# Patient Record
Sex: Female | Born: 1960
Health system: Southern US, Community
[De-identification: ages and names within clinical notes are randomized; demographics above are authoritative.]

## PROBLEM LIST (undated history)

## (undated) DIAGNOSIS — M4800 Spinal stenosis, site unspecified: Secondary | ICD-10-CM

## (undated) DIAGNOSIS — F419 Anxiety disorder, unspecified: Secondary | ICD-10-CM

## (undated) DIAGNOSIS — E559 Vitamin D deficiency, unspecified: Secondary | ICD-10-CM

## (undated) DIAGNOSIS — E7800 Pure hypercholesterolemia, unspecified: Secondary | ICD-10-CM

## (undated) DIAGNOSIS — D75839 Thrombocytosis, unspecified: Secondary | ICD-10-CM

## (undated) DIAGNOSIS — F329 Major depressive disorder, single episode, unspecified: Secondary | ICD-10-CM

## (undated) DIAGNOSIS — I2510 Atherosclerotic heart disease of native coronary artery without angina pectoris: Secondary | ICD-10-CM

## (undated) DIAGNOSIS — Z87828 Personal history of other (healed) physical injury and trauma: Secondary | ICD-10-CM

## (undated) DIAGNOSIS — R55 Syncope and collapse: Secondary | ICD-10-CM

## (undated) DIAGNOSIS — I10 Essential (primary) hypertension: Secondary | ICD-10-CM

## (undated) DIAGNOSIS — F32A Depression, unspecified: Secondary | ICD-10-CM

## (undated) DIAGNOSIS — D473 Essential (hemorrhagic) thrombocythemia: Secondary | ICD-10-CM

## (undated) HISTORY — DX: Essential (hemorrhagic) thrombocythemia: D47.3

## (undated) HISTORY — DX: Thrombocytosis, unspecified: D75.839

## (undated) HISTORY — PX: ABDOMINAL HYSTERECTOMY: SHX81

## (undated) HISTORY — DX: Vitamin D deficiency, unspecified: E55.9

## (undated) HISTORY — DX: Depression, unspecified: F32.A

## (undated) HISTORY — DX: Atherosclerotic heart disease of native coronary artery without angina pectoris: I25.10

## (undated) HISTORY — DX: Major depressive disorder, single episode, unspecified: F32.9

## (undated) HISTORY — DX: Personal history of other (healed) physical injury and trauma: Z87.828

## (undated) HISTORY — DX: Syncope and collapse: R55

## (undated) HISTORY — DX: Essential (primary) hypertension: I10

## (undated) HISTORY — DX: Anxiety disorder, unspecified: F41.9

## (undated) HISTORY — DX: Pure hypercholesterolemia, unspecified: E78.00

## (undated) HISTORY — PX: OTHER SURGICAL HISTORY: SHX169

## (undated) HISTORY — DX: Spinal stenosis, site unspecified: M48.00

---

## 2002-05-19 ENCOUNTER — Emergency Department (HOSPITAL_COMMUNITY): Admission: EM | Admit: 2002-05-19 | Discharge: 2002-05-19 | Payer: Self-pay | Admitting: *Deleted

## 2004-10-11 ENCOUNTER — Emergency Department (HOSPITAL_COMMUNITY): Admission: EM | Admit: 2004-10-11 | Discharge: 2004-10-12 | Payer: Self-pay | Admitting: Emergency Medicine

## 2004-12-23 ENCOUNTER — Ambulatory Visit: Payer: Self-pay | Admitting: Family Medicine

## 2005-02-19 ENCOUNTER — Ambulatory Visit: Payer: Self-pay | Admitting: Family Medicine

## 2005-03-23 ENCOUNTER — Ambulatory Visit: Payer: Self-pay | Admitting: Family Medicine

## 2005-03-23 ENCOUNTER — Emergency Department (HOSPITAL_COMMUNITY): Admission: EM | Admit: 2005-03-23 | Discharge: 2005-03-23 | Payer: Self-pay | Admitting: Emergency Medicine

## 2011-03-25 DIAGNOSIS — Z720 Tobacco use: Secondary | ICD-10-CM | POA: Insufficient documentation

## 2011-03-25 DIAGNOSIS — Z8719 Personal history of other diseases of the digestive system: Secondary | ICD-10-CM | POA: Insufficient documentation

## 2011-03-25 DIAGNOSIS — I10 Essential (primary) hypertension: Secondary | ICD-10-CM | POA: Insufficient documentation

## 2013-09-13 ENCOUNTER — Ambulatory Visit (INDEPENDENT_AMBULATORY_CARE_PROVIDER_SITE_OTHER): Payer: Medicare Other | Admitting: Psychiatry

## 2013-09-13 ENCOUNTER — Encounter (HOSPITAL_COMMUNITY): Payer: Self-pay | Admitting: Psychiatry

## 2013-09-13 VITALS — BP 120/84 | Ht 62.0 in | Wt 137.0 lb

## 2013-09-13 DIAGNOSIS — F3289 Other specified depressive episodes: Secondary | ICD-10-CM

## 2013-09-13 DIAGNOSIS — F329 Major depressive disorder, single episode, unspecified: Secondary | ICD-10-CM | POA: Insufficient documentation

## 2013-09-13 DIAGNOSIS — F331 Major depressive disorder, recurrent, moderate: Secondary | ICD-10-CM
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
## 2013-10-06 ENCOUNTER — Ambulatory Visit (INDEPENDENT_AMBULATORY_CARE_PROVIDER_SITE_OTHER): Payer: Medicare Other | Admitting: Psychology

## 2013-10-06 DIAGNOSIS — F331 Major depressive disorder, recurrent, moderate: Secondary | ICD-10-CM

## 2013-10-11 ENCOUNTER — Ambulatory Visit (INDEPENDENT_AMBULATORY_CARE_PROVIDER_SITE_OTHER): Payer: Medicare Other | Admitting: Psychiatry

## 2013-10-11 ENCOUNTER — Encounter (HOSPITAL_COMMUNITY): Payer: Self-pay | Admitting: Psychiatry

## 2013-10-11 VITALS — BP 131/97 | HR 73 | Ht 62.0 in | Wt 139.0 lb

## 2013-10-11 DIAGNOSIS — F3289 Other specified depressive episodes: Secondary | ICD-10-CM

## 2013-10-11 DIAGNOSIS — F329 Major depressive disorder, single episode, unspecified: Secondary | ICD-10-CM

## 2013-10-11 DIAGNOSIS — F331 Major depressive disorder, recurrent, moderate: Secondary | ICD-10-CM

## 2013-10-11 MED ORDER — ALPRAZOLAM 1 MG PO TABS: 1.0000 mg | ORAL_TABLET | Freq: Every day | ORAL | Status: DC

## 2013-10-11 MED ORDER — DULOXETINE HCL 60 MG PO CPEP: 60.0000 mg | ORAL_CAPSULE | Freq: Every day | ORAL | Status: DC

## 2013-10-11 MED ORDER — DULOXETINE HCL 60 MG PO CPEP
60.0000 mg | ORAL_CAPSULE | Freq: Every day | ORAL | Status: DC
Start: 2013-10-11 — End: 2013-10-11
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
## 2013-11-07 ENCOUNTER — Ambulatory Visit (INDEPENDENT_AMBULATORY_CARE_PROVIDER_SITE_OTHER): Payer: Medicare Other | Admitting: Psychology

## 2013-11-07 ENCOUNTER — Encounter (HOSPITAL_COMMUNITY): Payer: Self-pay | Admitting: Psychology

## 2013-11-07 DIAGNOSIS — R443 Hallucinations, unspecified: Secondary | ICD-10-CM

## 2013-11-07 DIAGNOSIS — F331 Major depressive disorder, recurrent, moderate: Secondary | ICD-10-CM

## 2013-11-07 DIAGNOSIS — R442 Other hallucinations: Secondary | ICD-10-CM
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
## 2013-11-22 ENCOUNTER — Ambulatory Visit (INDEPENDENT_AMBULATORY_CARE_PROVIDER_SITE_OTHER): Payer: Medicare Other | Admitting: Psychiatry

## 2013-11-22 ENCOUNTER — Encounter (HOSPITAL_COMMUNITY): Payer: Self-pay | Admitting: Psychiatry

## 2013-11-22 VITALS — BP 90/60 | HR 107 | Ht 62.0 in | Wt 141.0 lb

## 2013-11-22 DIAGNOSIS — F3289 Other specified depressive episodes: Secondary | ICD-10-CM

## 2013-11-22 DIAGNOSIS — F331 Major depressive disorder, recurrent, moderate: Secondary | ICD-10-CM

## 2013-11-22 DIAGNOSIS — F329 Major depressive disorder, single episode, unspecified: Secondary | ICD-10-CM

## 2013-11-22 MED ORDER — ALPRAZOLAM 1 MG PO TABS: ORAL_TABLET | ORAL | Status: DC
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
## 2013-12-06 ENCOUNTER — Ambulatory Visit (HOSPITAL_COMMUNITY): Payer: Self-pay | Admitting: Psychology

## 2014-01-05 ENCOUNTER — Ambulatory Visit (HOSPITAL_COMMUNITY): Payer: Self-pay | Admitting: Psychology

## 2014-01-09 ENCOUNTER — Ambulatory Visit (HOSPITAL_COMMUNITY): Payer: Self-pay | Admitting: Psychology

## 2014-01-10 ENCOUNTER — Telehealth (HOSPITAL_COMMUNITY): Payer: Self-pay | Admitting: *Deleted

## 2014-02-21 ENCOUNTER — Ambulatory Visit (HOSPITAL_COMMUNITY): Payer: Self-pay | Admitting: Psychiatry

## 2014-04-18 ENCOUNTER — Encounter (HOSPITAL_COMMUNITY): Payer: Self-pay | Admitting: Psychiatry

## 2014-04-18 ENCOUNTER — Ambulatory Visit (INDEPENDENT_AMBULATORY_CARE_PROVIDER_SITE_OTHER): Payer: Medicare Other | Admitting: Psychiatry

## 2014-04-18 VITALS — BP 128/85 | HR 80 | Ht 62.0 in | Wt 128.0 lb

## 2014-04-18 DIAGNOSIS — F331 Major depressive disorder, recurrent, moderate: Secondary | ICD-10-CM

## 2014-04-18 DIAGNOSIS — F329 Major depressive disorder, single episode, unspecified: Secondary | ICD-10-CM

## 2014-04-18 MED ORDER — ALPRAZOLAM 1 MG PO TABS: ORAL_TABLET | ORAL | Status: DC

## 2014-04-18 MED ORDER — FLUOXETINE HCL 20 MG PO CAPS: 20.0000 mg | ORAL_CAPSULE | Freq: Every day | ORAL | Status: DC
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
## 2014-05-16 ENCOUNTER — Ambulatory Visit (INDEPENDENT_AMBULATORY_CARE_PROVIDER_SITE_OTHER): Payer: Medicare Other | Admitting: Psychiatry

## 2014-05-16 ENCOUNTER — Encounter (HOSPITAL_COMMUNITY): Payer: Self-pay | Admitting: Psychiatry

## 2014-05-16 VITALS — BP 99/61 | HR 89 | Ht 62.0 in | Wt 131.0 lb

## 2014-05-16 DIAGNOSIS — F331 Major depressive disorder, recurrent, moderate: Secondary | ICD-10-CM

## 2014-05-16 DIAGNOSIS — F329 Major depressive disorder, single episode, unspecified: Secondary | ICD-10-CM

## 2014-05-16 MED ORDER — DULOXETINE HCL 60 MG PO CPEP: 60.0000 mg | ORAL_CAPSULE | Freq: Every day | ORAL | Status: DC

## 2014-05-16 MED ORDER — QUETIAPINE FUMARATE 25 MG PO TABS: 25.0000 mg | ORAL_TABLET | Freq: Every day | ORAL | Status: DC
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
## 2014-06-27 ENCOUNTER — Encounter (HOSPITAL_COMMUNITY): Payer: Self-pay | Admitting: Psychiatry

## 2014-06-27 ENCOUNTER — Ambulatory Visit (INDEPENDENT_AMBULATORY_CARE_PROVIDER_SITE_OTHER): Payer: Medicare Other | Admitting: Psychiatry

## 2014-06-27 VITALS — BP 140/100 | HR 76 | Ht 62.0 in | Wt 131.8 lb

## 2014-06-27 DIAGNOSIS — F331 Major depressive disorder, recurrent, moderate: Secondary | ICD-10-CM

## 2014-06-27 DIAGNOSIS — F329 Major depressive disorder, single episode, unspecified: Secondary | ICD-10-CM | POA: Diagnosis not present

## 2014-06-27 MED ORDER — DULOXETINE HCL 60 MG PO CPEP: 60.0000 mg | ORAL_CAPSULE | Freq: Every day | ORAL | Status: DC

## 2014-06-27 MED ORDER — QUETIAPINE FUMARATE 25 MG PO TABS: 25.0000 mg | ORAL_TABLET | Freq: Every day | ORAL | Status: DC
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
## 2014-09-27 ENCOUNTER — Ambulatory Visit (HOSPITAL_COMMUNITY): Payer: Self-pay | Admitting: Psychiatry

## 2014-10-09 ENCOUNTER — Encounter (HOSPITAL_COMMUNITY): Payer: Self-pay | Admitting: Psychiatry

## 2014-10-09 ENCOUNTER — Ambulatory Visit (INDEPENDENT_AMBULATORY_CARE_PROVIDER_SITE_OTHER): Payer: Medicare Other | Admitting: Psychiatry

## 2014-10-09 VITALS — BP 191/120 | HR 89 | Ht 62.0 in | Wt 126.6 lb

## 2014-10-09 DIAGNOSIS — F331 Major depressive disorder, recurrent, moderate: Secondary | ICD-10-CM

## 2014-10-09 DIAGNOSIS — F329 Major depressive disorder, single episode, unspecified: Secondary | ICD-10-CM

## 2014-10-09 MED ORDER — QUETIAPINE FUMARATE 25 MG PO TABS: 25.0000 mg | ORAL_TABLET | Freq: Every day | ORAL | Status: DC

## 2014-10-09 MED ORDER — DULOXETINE HCL 60 MG PO CPEP: 60.0000 mg | ORAL_CAPSULE | Freq: Every day | ORAL | Status: DC
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
## 2015-01-09 ENCOUNTER — Ambulatory Visit (HOSPITAL_COMMUNITY): Payer: Self-pay | Admitting: Psychiatry

## 2015-01-14 ENCOUNTER — Encounter (HOSPITAL_COMMUNITY): Payer: Self-pay | Admitting: Psychiatry

## 2015-01-15 DIAGNOSIS — I517 Cardiomegaly: Secondary | ICD-10-CM | POA: Insufficient documentation

## 2015-03-01 ENCOUNTER — Encounter (HOSPITAL_COMMUNITY): Payer: Self-pay | Admitting: Psychiatry

## 2015-03-01 ENCOUNTER — Ambulatory Visit (HOSPITAL_COMMUNITY): Payer: Self-pay | Admitting: Psychiatry

## 2015-03-21 DIAGNOSIS — F3341 Major depressive disorder, recurrent, in partial remission: Secondary | ICD-10-CM | POA: Insufficient documentation

## 2015-04-01 ENCOUNTER — Encounter (HOSPITAL_COMMUNITY): Payer: Self-pay | Admitting: Psychiatry

## 2015-04-01 ENCOUNTER — Ambulatory Visit (INDEPENDENT_AMBULATORY_CARE_PROVIDER_SITE_OTHER): Payer: Medicare HMO | Admitting: Psychiatry

## 2015-04-01 VITALS — BP 150/95 | HR 89 | Ht 62.0 in | Wt 147.0 lb

## 2015-04-01 DIAGNOSIS — F331 Major depressive disorder, recurrent, moderate: Secondary | ICD-10-CM | POA: Diagnosis not present

## 2015-04-01 MED ORDER — DULOXETINE HCL 60 MG PO CPEP: 60.0000 mg | ORAL_CAPSULE | Freq: Every day | ORAL | Status: DC

## 2015-04-01 MED ORDER — QUETIAPINE FUMARATE 25 MG PO TABS: 25.0000 mg | ORAL_TABLET | Freq: Every day | ORAL | Status: DC
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
## 2015-06-28 ENCOUNTER — Encounter (HOSPITAL_COMMUNITY): Payer: Self-pay | Admitting: Psychiatry

## 2015-06-28 ENCOUNTER — Ambulatory Visit (INDEPENDENT_AMBULATORY_CARE_PROVIDER_SITE_OTHER): Payer: Medicare HMO | Admitting: Psychiatry

## 2015-06-28 VITALS — BP 150/100 | Ht 62.0 in | Wt 139.0 lb

## 2015-06-28 DIAGNOSIS — F331 Major depressive disorder, recurrent, moderate: Secondary | ICD-10-CM

## 2015-06-28 MED ORDER — DULOXETINE HCL 60 MG PO CPEP: 60.0000 mg | ORAL_CAPSULE | Freq: Every day | ORAL | Status: DC

## 2015-06-28 MED ORDER — QUETIAPINE FUMARATE 25 MG PO TABS: 25.0000 mg | ORAL_TABLET | Freq: Every day | ORAL | Status: DC
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
## 2015-07-05 ENCOUNTER — Encounter: Payer: Self-pay | Admitting: Critical Care Medicine

## 2015-07-05 ENCOUNTER — Other Ambulatory Visit: Payer: Self-pay | Admitting: Critical Care Medicine

## 2015-07-05 LAB — BASIC METABOLIC PANEL: Glucose: 90 mg/dL

## 2015-07-05 MED ORDER — METOPROLOL TARTRATE 25 MG PO TABS: 50.0000 mg | ORAL_TABLET | Freq: Two times a day (BID) | ORAL | Status: DC

## 2015-07-05 NOTE — Progress Notes (Unsigned)
Patient ID: Hayley Juarez, female   DOB: Aug 01, 1960, 55 y.o.   MRN: [id number] bp high  177/97  On metoprop 25 bid  Plan increase to 50 bid  Needs pcp f/u soon  Cont lisinopril/amlodipine and hctz  Asencion Noble

## 2015-09-26 ENCOUNTER — Ambulatory Visit (HOSPITAL_COMMUNITY): Payer: Self-pay | Admitting: Psychiatry

## 2015-10-21 ENCOUNTER — Encounter (HOSPITAL_COMMUNITY): Payer: Self-pay | Admitting: Psychiatry

## 2015-10-21 ENCOUNTER — Ambulatory Visit (INDEPENDENT_AMBULATORY_CARE_PROVIDER_SITE_OTHER): Payer: Medicare HMO | Admitting: Psychiatry

## 2015-10-21 VITALS — BP 181/112 | HR 83 | Ht 62.0 in | Wt 139.6 lb

## 2015-10-21 DIAGNOSIS — F331 Major depressive disorder, recurrent, moderate: Secondary | ICD-10-CM

## 2015-10-21 MED ORDER — QUETIAPINE FUMARATE 25 MG PO TABS: 25.0000 mg | ORAL_TABLET | Freq: Every day | ORAL | 2 refills | Status: DC

## 2015-10-21 MED ORDER — DULOXETINE HCL 60 MG PO CPEP: 60.0000 mg | ORAL_CAPSULE | Freq: Every day | ORAL | 2 refills | Status: DC
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
## 2016-01-08 ENCOUNTER — Ambulatory Visit (HOSPITAL_COMMUNITY): Payer: Self-pay | Admitting: Psychiatry

## 2016-01-08 ENCOUNTER — Encounter (HOSPITAL_COMMUNITY): Payer: Self-pay

## 2016-02-10 ENCOUNTER — Telehealth (HOSPITAL_COMMUNITY): Payer: Self-pay | Admitting: *Deleted

## 2016-02-10 NOTE — Telephone Encounter (Signed)
We can give her one more chance, then that's it

## 2016-02-10 NOTE — Telephone Encounter (Signed)
NO SHOW; 09/27/14; 01/09/15; 03/01/15; 01/08/16, she wants to schedule an appointment.

## 2016-02-11 NOTE — Telephone Encounter (Signed)
Called pt and informed her with what provider stated and pt verbalized understanding. Pt resch appt with provider

## 2016-03-03 ENCOUNTER — Encounter (HOSPITAL_COMMUNITY): Payer: Self-pay | Admitting: Psychiatry

## 2016-03-03 ENCOUNTER — Ambulatory Visit (INDEPENDENT_AMBULATORY_CARE_PROVIDER_SITE_OTHER): Payer: 59 | Admitting: Psychiatry

## 2016-03-03 VITALS — BP 128/84 | Ht 62.0 in | Wt 138.0 lb

## 2016-03-03 DIAGNOSIS — Z811 Family history of alcohol abuse and dependence: Secondary | ICD-10-CM | POA: Diagnosis not present

## 2016-03-03 DIAGNOSIS — Z818 Family history of other mental and behavioral disorders: Secondary | ICD-10-CM

## 2016-03-03 DIAGNOSIS — Z9109 Other allergy status, other than to drugs and biological substances: Secondary | ICD-10-CM

## 2016-03-03 DIAGNOSIS — Z79899 Other long term (current) drug therapy: Secondary | ICD-10-CM

## 2016-03-03 DIAGNOSIS — Z813 Family history of other psychoactive substance abuse and dependence: Secondary | ICD-10-CM

## 2016-03-03 DIAGNOSIS — F331 Major depressive disorder, recurrent, moderate: Secondary | ICD-10-CM | POA: Diagnosis not present

## 2016-03-03 MED ORDER — QUETIAPINE FUMARATE 25 MG PO TABS: 25.0000 mg | ORAL_TABLET | Freq: Every day | ORAL | 2 refills | Status: DC

## 2016-03-03 MED ORDER — DULOXETINE HCL 60 MG PO CPEP: 60.0000 mg | ORAL_CAPSULE | Freq: Two times a day (BID) | ORAL | 2 refills | Status: DC
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
## 2016-06-01 ENCOUNTER — Ambulatory Visit (INDEPENDENT_AMBULATORY_CARE_PROVIDER_SITE_OTHER): Payer: 59 | Admitting: Psychiatry

## 2016-06-01 ENCOUNTER — Encounter (HOSPITAL_COMMUNITY): Payer: Self-pay | Admitting: Psychiatry

## 2016-06-01 ENCOUNTER — Telehealth (HOSPITAL_COMMUNITY): Payer: Self-pay | Admitting: *Deleted

## 2016-06-01 VITALS — BP 125/83 | HR 81 | Ht 62.0 in | Wt 143.0 lb

## 2016-06-01 DIAGNOSIS — Z811 Family history of alcohol abuse and dependence: Secondary | ICD-10-CM | POA: Diagnosis not present

## 2016-06-01 DIAGNOSIS — Z818 Family history of other mental and behavioral disorders: Secondary | ICD-10-CM

## 2016-06-01 DIAGNOSIS — F331 Major depressive disorder, recurrent, moderate: Secondary | ICD-10-CM | POA: Diagnosis not present

## 2016-06-01 DIAGNOSIS — Z813 Family history of other psychoactive substance abuse and dependence: Secondary | ICD-10-CM

## 2016-06-01 DIAGNOSIS — Z79899 Other long term (current) drug therapy: Secondary | ICD-10-CM

## 2016-06-01 MED ORDER — DULOXETINE HCL 60 MG PO CPEP: 60.0000 mg | ORAL_CAPSULE | Freq: Two times a day (BID) | ORAL | 2 refills | Status: DC

## 2016-06-01 MED ORDER — BUPROPION HCL ER (XL) 300 MG PO TB24: 300.0000 mg | ORAL_TABLET | ORAL | 2 refills | Status: DC

## 2016-06-01 MED ORDER — QUETIAPINE FUMARATE 25 MG PO TABS: 25.0000 mg | ORAL_TABLET | Freq: Every day | ORAL | 2 refills | Status: DC
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
## 2016-06-01 NOTE — Telephone Encounter (Signed)
Pt pharmacy [facility] faxed refill request for pt Seroquel 25 mg. Per pt chart, 03-03-2016 30 tabs with 2 refills. Pt f/u appt is 06-01-2016. Pt was last seen 03-03-2016.

## 2016-07-29 ENCOUNTER — Telehealth (HOSPITAL_COMMUNITY): Payer: Self-pay | Admitting: *Deleted

## 2016-07-29 NOTE — Telephone Encounter (Signed)
Called pt to resch her appt for 08-18-2016 due to provider being out of office. lmtcb and office number provided

## 2016-08-13 ENCOUNTER — Encounter (HOSPITAL_COMMUNITY): Payer: Self-pay | Admitting: Psychiatry

## 2016-08-13 ENCOUNTER — Ambulatory Visit (INDEPENDENT_AMBULATORY_CARE_PROVIDER_SITE_OTHER): Payer: 59 | Admitting: Psychiatry

## 2016-08-13 VITALS — BP 93/64 | HR 76 | Ht 62.0 in | Wt 138.4 lb

## 2016-08-13 DIAGNOSIS — Z811 Family history of alcohol abuse and dependence: Secondary | ICD-10-CM

## 2016-08-13 DIAGNOSIS — Z818 Family history of other mental and behavioral disorders: Secondary | ICD-10-CM

## 2016-08-13 DIAGNOSIS — Z813 Family history of other psychoactive substance abuse and dependence: Secondary | ICD-10-CM | POA: Diagnosis not present

## 2016-08-13 DIAGNOSIS — F331 Major depressive disorder, recurrent, moderate: Secondary | ICD-10-CM | POA: Diagnosis not present

## 2016-08-13 MED ORDER — QUETIAPINE FUMARATE 50 MG PO TABS: 50.0000 mg | ORAL_TABLET | Freq: Every day | ORAL | 2 refills | Status: DC

## 2016-08-13 MED ORDER — DULOXETINE HCL 60 MG PO CPEP: 60.0000 mg | ORAL_CAPSULE | Freq: Two times a day (BID) | ORAL | 2 refills | Status: DC

## 2016-08-13 MED ORDER — BUPROPION HCL ER (XL) 300 MG PO TB24: 300.0000 mg | ORAL_TABLET | ORAL | 2 refills | Status: DC
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
## 2016-08-18 ENCOUNTER — Ambulatory Visit (HOSPITAL_COMMUNITY): Payer: Self-pay | Admitting: Psychiatry

## 2016-08-24 ENCOUNTER — Other Ambulatory Visit (HOSPITAL_COMMUNITY): Payer: Self-pay | Admitting: Psychiatry

## 2016-09-02 ENCOUNTER — Telehealth (HOSPITAL_COMMUNITY): Payer: Self-pay | Admitting: *Deleted

## 2016-09-02 NOTE — Telephone Encounter (Signed)
phone call from patient, she need someone to call her regarding her Burtriopion XL 300 mg. (patient spelled medication) tab.

## 2016-09-02 NOTE — Telephone Encounter (Signed)
This was sent in on 08/13/16 with 2 refills. Please call and inform her

## 2016-09-04 NOTE — Telephone Encounter (Signed)
States she was able to verify that her prescriptions were taken care and she had no additional questions.

## 2016-10-21 ENCOUNTER — Other Ambulatory Visit (HOSPITAL_COMMUNITY): Payer: Self-pay | Admitting: Psychiatry

## 2016-11-06 ENCOUNTER — Ambulatory Visit (INDEPENDENT_AMBULATORY_CARE_PROVIDER_SITE_OTHER): Payer: 59 | Admitting: Psychiatry

## 2016-11-06 ENCOUNTER — Encounter (HOSPITAL_COMMUNITY): Payer: Self-pay | Admitting: Psychiatry

## 2016-11-06 VITALS — BP 123/81 | HR 75 | Ht 62.0 in | Wt 144.4 lb

## 2016-11-06 DIAGNOSIS — Z811 Family history of alcohol abuse and dependence: Secondary | ICD-10-CM

## 2016-11-06 DIAGNOSIS — G4700 Insomnia, unspecified: Secondary | ICD-10-CM | POA: Diagnosis not present

## 2016-11-06 DIAGNOSIS — R55 Syncope and collapse: Secondary | ICD-10-CM

## 2016-11-06 DIAGNOSIS — M549 Dorsalgia, unspecified: Secondary | ICD-10-CM | POA: Diagnosis not present

## 2016-11-06 DIAGNOSIS — Z91410 Personal history of adult physical and sexual abuse: Secondary | ICD-10-CM | POA: Diagnosis not present

## 2016-11-06 DIAGNOSIS — F419 Anxiety disorder, unspecified: Secondary | ICD-10-CM | POA: Diagnosis not present

## 2016-11-06 DIAGNOSIS — G8929 Other chronic pain: Secondary | ICD-10-CM | POA: Diagnosis not present

## 2016-11-06 DIAGNOSIS — M791 Myalgia: Secondary | ICD-10-CM

## 2016-11-06 DIAGNOSIS — M2550 Pain in unspecified joint: Secondary | ICD-10-CM | POA: Diagnosis not present

## 2016-11-06 DIAGNOSIS — Z91411 Personal history of adult psychological abuse: Secondary | ICD-10-CM

## 2016-11-06 DIAGNOSIS — Z818 Family history of other mental and behavioral disorders: Secondary | ICD-10-CM

## 2016-11-06 DIAGNOSIS — F331 Major depressive disorder, recurrent, moderate: Secondary | ICD-10-CM | POA: Diagnosis not present

## 2016-11-06 DIAGNOSIS — R450 Nervousness: Secondary | ICD-10-CM

## 2016-11-06 DIAGNOSIS — Z736 Limitation of activities due to disability: Secondary | ICD-10-CM | POA: Diagnosis not present

## 2016-11-06 DIAGNOSIS — Z813 Family history of other psychoactive substance abuse and dependence: Secondary | ICD-10-CM

## 2016-11-06 MED ORDER — BUPROPION HCL ER (XL) 300 MG PO TB24: 300.0000 mg | ORAL_TABLET | ORAL | 2 refills | Status: DC

## 2016-11-06 MED ORDER — QUETIAPINE FUMARATE 50 MG PO TABS
50.0000 mg | ORAL_TABLET | Freq: Every day | ORAL | 2 refills | Status: DC
Start: 2016-11-06 — End: 2017-01-26

## 2016-11-06 MED ORDER — DULOXETINE HCL 60 MG PO CPEP: 60.0000 mg | ORAL_CAPSULE | Freq: Two times a day (BID) | ORAL | 2 refills | Status: DC
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
## 2016-11-09 ENCOUNTER — Ambulatory Visit (HOSPITAL_COMMUNITY): Payer: Self-pay | Admitting: Psychiatry

## 2017-01-19 ENCOUNTER — Other Ambulatory Visit (HOSPITAL_COMMUNITY): Payer: Self-pay | Admitting: Psychiatry

## 2017-01-26 ENCOUNTER — Ambulatory Visit (HOSPITAL_COMMUNITY): Payer: 59 | Admitting: Psychiatry

## 2017-01-26 ENCOUNTER — Encounter (HOSPITAL_COMMUNITY): Payer: Self-pay | Admitting: Psychiatry

## 2017-01-26 VITALS — BP 126/82 | HR 80 | Ht 62.0 in | Wt 144.0 lb

## 2017-01-26 DIAGNOSIS — F1210 Cannabis abuse, uncomplicated: Secondary | ICD-10-CM | POA: Diagnosis not present

## 2017-01-26 DIAGNOSIS — Z736 Limitation of activities due to disability: Secondary | ICD-10-CM

## 2017-01-26 DIAGNOSIS — Z818 Family history of other mental and behavioral disorders: Secondary | ICD-10-CM | POA: Diagnosis not present

## 2017-01-26 DIAGNOSIS — M549 Dorsalgia, unspecified: Secondary | ICD-10-CM | POA: Diagnosis not present

## 2017-01-26 DIAGNOSIS — Z91410 Personal history of adult physical and sexual abuse: Secondary | ICD-10-CM

## 2017-01-26 DIAGNOSIS — F17210 Nicotine dependence, cigarettes, uncomplicated: Secondary | ICD-10-CM | POA: Diagnosis not present

## 2017-01-26 DIAGNOSIS — M2550 Pain in unspecified joint: Secondary | ICD-10-CM | POA: Diagnosis not present

## 2017-01-26 DIAGNOSIS — Z811 Family history of alcohol abuse and dependence: Secondary | ICD-10-CM

## 2017-01-26 DIAGNOSIS — Z91411 Personal history of adult psychological abuse: Secondary | ICD-10-CM

## 2017-01-26 DIAGNOSIS — Z813 Family history of other psychoactive substance abuse and dependence: Secondary | ICD-10-CM

## 2017-01-26 DIAGNOSIS — F331 Major depressive disorder, recurrent, moderate: Secondary | ICD-10-CM

## 2017-01-26 DIAGNOSIS — G4700 Insomnia, unspecified: Secondary | ICD-10-CM | POA: Diagnosis not present

## 2017-01-26 MED ORDER — QUETIAPINE FUMARATE 100 MG PO TABS: 100.0000 mg | ORAL_TABLET | Freq: Every day | ORAL | 2 refills | Status: DC

## 2017-01-26 MED ORDER — DULOXETINE HCL 60 MG PO CPEP: 60.0000 mg | ORAL_CAPSULE | Freq: Two times a day (BID) | ORAL | 2 refills | Status: DC

## 2017-01-26 MED ORDER — BUPROPION HCL ER (XL) 300 MG PO TB24: 300.0000 mg | ORAL_TABLET | ORAL | 2 refills | Status: DC
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
## 2017-02-03 ENCOUNTER — Telehealth (HOSPITAL_COMMUNITY): Payer: Self-pay | Admitting: *Deleted

## 2017-02-03 ENCOUNTER — Other Ambulatory Visit (HOSPITAL_COMMUNITY): Payer: Self-pay | Admitting: Psychiatry

## 2017-02-03 DIAGNOSIS — Z1211 Encounter for screening for malignant neoplasm of colon: Secondary | ICD-10-CM | POA: Insufficient documentation

## 2017-02-03 MED ORDER — BUPROPION HCL ER (XL) 300 MG PO TB24: 300.0000 mg | ORAL_TABLET | ORAL | 2 refills | Status: DC

## 2017-02-03 MED ORDER — QUETIAPINE FUMARATE 100 MG PO TABS: 100.0000 mg | ORAL_TABLET | Freq: Every day | ORAL | 2 refills | Status: DC

## 2017-02-03 MED ORDER — DULOXETINE HCL 60 MG PO CPEP: 60.0000 mg | ORAL_CAPSULE | Freq: Two times a day (BID) | ORAL | 2 refills | Status: DC

## 2017-02-03 NOTE — Telephone Encounter (Signed)
Refills went to wrong pharmacy. Went to [facility]. Correct Pharmacy has been updated.

## 2017-02-03 NOTE — Telephone Encounter (Signed)
voice message from pharmacy, need refill for patient.

## 2017-02-03 NOTE — Telephone Encounter (Signed)
Dr Harrington Challenger  I called [facility] they are requesting e-scribe refills for 30 days on the following medications:  Cymbalta 60 mg--  Welbutrin Xl  300 mg-- Seroquel 50 mg. Next appointment is 03/29/17

## 2017-02-03 NOTE — Telephone Encounter (Signed)
These were sent on 12/4, did they not receive them?

## 2017-02-03 NOTE — Telephone Encounter (Signed)
resent

## 2017-03-29 ENCOUNTER — Ambulatory Visit (HOSPITAL_COMMUNITY): Payer: Self-pay | Admitting: Psychiatry

## 2017-04-07 DIAGNOSIS — K621 Rectal polyp: Secondary | ICD-10-CM | POA: Insufficient documentation

## 2017-04-19 ENCOUNTER — Ambulatory Visit (HOSPITAL_COMMUNITY): Payer: Medicare HMO | Admitting: Psychiatry

## 2017-04-19 ENCOUNTER — Encounter (HOSPITAL_COMMUNITY): Payer: Self-pay | Admitting: Psychiatry

## 2017-04-19 VITALS — BP 125/86 | HR 72 | Ht 62.0 in | Wt 151.0 lb

## 2017-04-19 DIAGNOSIS — M549 Dorsalgia, unspecified: Secondary | ICD-10-CM

## 2017-04-19 DIAGNOSIS — M2550 Pain in unspecified joint: Secondary | ICD-10-CM

## 2017-04-19 DIAGNOSIS — R443 Hallucinations, unspecified: Secondary | ICD-10-CM

## 2017-04-19 DIAGNOSIS — F331 Major depressive disorder, recurrent, moderate: Secondary | ICD-10-CM | POA: Diagnosis not present

## 2017-04-19 DIAGNOSIS — Z91410 Personal history of adult physical and sexual abuse: Secondary | ICD-10-CM | POA: Diagnosis not present

## 2017-04-19 DIAGNOSIS — M542 Cervicalgia: Secondary | ICD-10-CM

## 2017-04-19 DIAGNOSIS — Z915 Personal history of self-harm: Secondary | ICD-10-CM | POA: Diagnosis not present

## 2017-04-19 DIAGNOSIS — Z811 Family history of alcohol abuse and dependence: Secondary | ICD-10-CM

## 2017-04-19 DIAGNOSIS — F1290 Cannabis use, unspecified, uncomplicated: Secondary | ICD-10-CM | POA: Diagnosis not present

## 2017-04-19 DIAGNOSIS — Z818 Family history of other mental and behavioral disorders: Secondary | ICD-10-CM | POA: Diagnosis not present

## 2017-04-19 DIAGNOSIS — F17290 Nicotine dependence, other tobacco product, uncomplicated: Secondary | ICD-10-CM

## 2017-04-19 DIAGNOSIS — Z736 Limitation of activities due to disability: Secondary | ICD-10-CM

## 2017-04-19 DIAGNOSIS — Z813 Family history of other psychoactive substance abuse and dependence: Secondary | ICD-10-CM | POA: Diagnosis not present

## 2017-04-19 MED ORDER — QUETIAPINE FUMARATE 100 MG PO TABS: 100.0000 mg | ORAL_TABLET | Freq: Every day | ORAL | 2 refills | Status: DC

## 2017-04-19 MED ORDER — BUPROPION HCL ER (XL) 300 MG PO TB24: 300.0000 mg | ORAL_TABLET | ORAL | 2 refills | Status: DC

## 2017-04-19 MED ORDER — DULOXETINE HCL 60 MG PO CPEP: 60.0000 mg | ORAL_CAPSULE | Freq: Two times a day (BID) | ORAL | 2 refills | Status: DC
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
## 2017-04-21 ENCOUNTER — Other Ambulatory Visit (HOSPITAL_COMMUNITY): Payer: Self-pay | Admitting: Psychiatry

## 2017-04-21 MED ORDER — DULOXETINE HCL 60 MG PO CPEP: 60.0000 mg | ORAL_CAPSULE | Freq: Two times a day (BID) | ORAL | 2 refills | Status: DC

## 2017-04-21 MED ORDER — BUPROPION HCL ER (XL) 300 MG PO TB24: 300.0000 mg | ORAL_TABLET | ORAL | 2 refills | Status: DC

## 2017-04-21 MED ORDER — QUETIAPINE FUMARATE 100 MG PO TABS: 100.0000 mg | ORAL_TABLET | Freq: Every day | ORAL | 2 refills | Status: DC

## 2017-05-17 LAB — GLUCOSE, POCT (MANUAL RESULT ENTRY): POC Glucose: 94 mg/dl (ref 70–99)

## 2017-07-16 ENCOUNTER — Ambulatory Visit: Payer: Self-pay | Admitting: Family Medicine
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
## 2017-07-20 ENCOUNTER — Encounter (HOSPITAL_COMMUNITY): Payer: Self-pay | Admitting: Psychiatry

## 2017-07-20 ENCOUNTER — Ambulatory Visit (INDEPENDENT_AMBULATORY_CARE_PROVIDER_SITE_OTHER): Payer: Medicare HMO | Admitting: Psychiatry

## 2017-07-20 VITALS — BP 101/67 | HR 83 | Ht 62.0 in | Wt 143.0 lb

## 2017-07-20 DIAGNOSIS — Z811 Family history of alcohol abuse and dependence: Secondary | ICD-10-CM | POA: Diagnosis not present

## 2017-07-20 DIAGNOSIS — F331 Major depressive disorder, recurrent, moderate: Secondary | ICD-10-CM

## 2017-07-20 DIAGNOSIS — Z813 Family history of other psychoactive substance abuse and dependence: Secondary | ICD-10-CM | POA: Diagnosis not present

## 2017-07-20 DIAGNOSIS — M2550 Pain in unspecified joint: Secondary | ICD-10-CM | POA: Diagnosis not present

## 2017-07-20 DIAGNOSIS — G894 Chronic pain syndrome: Secondary | ICD-10-CM | POA: Diagnosis not present

## 2017-07-20 DIAGNOSIS — F1290 Cannabis use, unspecified, uncomplicated: Secondary | ICD-10-CM | POA: Diagnosis not present

## 2017-07-20 DIAGNOSIS — F17210 Nicotine dependence, cigarettes, uncomplicated: Secondary | ICD-10-CM

## 2017-07-20 DIAGNOSIS — Z818 Family history of other mental and behavioral disorders: Secondary | ICD-10-CM | POA: Diagnosis not present

## 2017-07-20 MED ORDER — QUETIAPINE FUMARATE 100 MG PO TABS: 100.0000 mg | ORAL_TABLET | Freq: Every day | ORAL | 2 refills | Status: DC

## 2017-07-20 MED ORDER — BUPROPION HCL ER (XL) 300 MG PO TB24: 300.0000 mg | ORAL_TABLET | ORAL | 2 refills | Status: DC

## 2017-07-20 MED ORDER — DULOXETINE HCL 60 MG PO CPEP: 60.0000 mg | ORAL_CAPSULE | Freq: Two times a day (BID) | ORAL | 2 refills | Status: DC
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
## 2017-07-27 ENCOUNTER — Ambulatory Visit: Payer: Medicare HMO | Admitting: Family Medicine

## 2017-07-28 DIAGNOSIS — Z299 Encounter for prophylactic measures, unspecified: Secondary | ICD-10-CM | POA: Diagnosis not present

## 2017-07-28 DIAGNOSIS — G894 Chronic pain syndrome: Secondary | ICD-10-CM | POA: Diagnosis not present

## 2017-07-28 DIAGNOSIS — I10 Essential (primary) hypertension: Secondary | ICD-10-CM | POA: Diagnosis not present

## 2017-07-28 DIAGNOSIS — Z6826 Body mass index (BMI) 26.0-26.9, adult: Secondary | ICD-10-CM | POA: Diagnosis not present

## 2017-07-28 DIAGNOSIS — I739 Peripheral vascular disease, unspecified: Secondary | ICD-10-CM | POA: Diagnosis not present

## 2017-08-09 DIAGNOSIS — I739 Peripheral vascular disease, unspecified: Secondary | ICD-10-CM | POA: Diagnosis not present

## 2017-08-09 DIAGNOSIS — G894 Chronic pain syndrome: Secondary | ICD-10-CM | POA: Diagnosis not present

## 2017-08-09 DIAGNOSIS — Z6826 Body mass index (BMI) 26.0-26.9, adult: Secondary | ICD-10-CM | POA: Diagnosis not present

## 2017-08-09 DIAGNOSIS — Z299 Encounter for prophylactic measures, unspecified: Secondary | ICD-10-CM | POA: Diagnosis not present

## 2017-08-09 DIAGNOSIS — I10 Essential (primary) hypertension: Secondary | ICD-10-CM | POA: Diagnosis not present

## 2017-08-24 DIAGNOSIS — M542 Cervicalgia: Secondary | ICD-10-CM | POA: Diagnosis not present

## 2017-08-24 DIAGNOSIS — Z79899 Other long term (current) drug therapy: Secondary | ICD-10-CM | POA: Diagnosis not present

## 2017-08-24 DIAGNOSIS — M545 Low back pain: Secondary | ICD-10-CM | POA: Diagnosis not present

## 2017-09-07 DIAGNOSIS — M5136 Other intervertebral disc degeneration, lumbar region: Secondary | ICD-10-CM | POA: Diagnosis not present

## 2017-09-07 DIAGNOSIS — G894 Chronic pain syndrome: Secondary | ICD-10-CM | POA: Diagnosis not present

## 2017-09-07 DIAGNOSIS — M5030 Other cervical disc degeneration, unspecified cervical region: Secondary | ICD-10-CM | POA: Diagnosis not present

## 2017-09-07 DIAGNOSIS — M542 Cervicalgia: Secondary | ICD-10-CM | POA: Diagnosis not present

## 2017-09-20 ENCOUNTER — Ambulatory Visit (HOSPITAL_COMMUNITY): Payer: Medicare HMO | Admitting: Psychiatry

## 2017-09-30 DIAGNOSIS — M5012 Mid-cervical disc disorder, unspecified level: Secondary | ICD-10-CM | POA: Diagnosis not present

## 2017-09-30 DIAGNOSIS — M542 Cervicalgia: Secondary | ICD-10-CM | POA: Diagnosis not present

## 2017-10-02 DIAGNOSIS — I509 Heart failure, unspecified: Secondary | ICD-10-CM | POA: Diagnosis not present

## 2017-10-02 DIAGNOSIS — F209 Schizophrenia, unspecified: Secondary | ICD-10-CM | POA: Diagnosis not present

## 2017-10-02 DIAGNOSIS — G8929 Other chronic pain: Secondary | ICD-10-CM | POA: Diagnosis not present

## 2017-10-02 DIAGNOSIS — Z7982 Long term (current) use of aspirin: Secondary | ICD-10-CM | POA: Diagnosis not present

## 2017-10-02 DIAGNOSIS — Z79899 Other long term (current) drug therapy: Secondary | ICD-10-CM | POA: Diagnosis not present

## 2017-10-02 DIAGNOSIS — R200 Anesthesia of skin: Secondary | ICD-10-CM | POA: Diagnosis not present

## 2017-10-02 DIAGNOSIS — M542 Cervicalgia: Secondary | ICD-10-CM | POA: Diagnosis not present

## 2017-10-02 DIAGNOSIS — F319 Bipolar disorder, unspecified: Secondary | ICD-10-CM | POA: Diagnosis not present

## 2017-10-02 DIAGNOSIS — I110 Hypertensive heart disease with heart failure: Secondary | ICD-10-CM | POA: Diagnosis not present

## 2017-10-05 DIAGNOSIS — R0789 Other chest pain: Secondary | ICD-10-CM | POA: Diagnosis not present

## 2017-10-05 DIAGNOSIS — M797 Fibromyalgia: Secondary | ICD-10-CM | POA: Diagnosis not present

## 2017-10-05 DIAGNOSIS — Z79899 Other long term (current) drug therapy: Secondary | ICD-10-CM | POA: Diagnosis not present

## 2017-10-05 DIAGNOSIS — F17200 Nicotine dependence, unspecified, uncomplicated: Secondary | ICD-10-CM | POA: Diagnosis not present

## 2017-10-05 DIAGNOSIS — R079 Chest pain, unspecified: Secondary | ICD-10-CM | POA: Diagnosis not present

## 2017-10-05 DIAGNOSIS — I509 Heart failure, unspecified: Secondary | ICD-10-CM | POA: Diagnosis not present

## 2017-10-05 DIAGNOSIS — F209 Schizophrenia, unspecified: Secondary | ICD-10-CM | POA: Diagnosis not present

## 2017-10-05 DIAGNOSIS — F319 Bipolar disorder, unspecified: Secondary | ICD-10-CM | POA: Diagnosis not present

## 2017-10-05 DIAGNOSIS — M940 Chondrocostal junction syndrome [Tietze]: Secondary | ICD-10-CM | POA: Diagnosis not present

## 2017-10-05 DIAGNOSIS — R9431 Abnormal electrocardiogram [ECG] [EKG]: Secondary | ICD-10-CM | POA: Diagnosis not present

## 2017-10-05 DIAGNOSIS — E7800 Pure hypercholesterolemia, unspecified: Secondary | ICD-10-CM | POA: Diagnosis not present

## 2017-10-05 DIAGNOSIS — I110 Hypertensive heart disease with heart failure: Secondary | ICD-10-CM | POA: Diagnosis not present

## 2017-10-06 ENCOUNTER — Other Ambulatory Visit: Payer: Self-pay | Admitting: Rehabilitation

## 2017-10-06 DIAGNOSIS — M542 Cervicalgia: Secondary | ICD-10-CM

## 2017-10-13 ENCOUNTER — Ambulatory Visit
Admission: RE | Admit: 2017-10-13 | Discharge: 2017-10-13 | Disposition: A | Discharge status: Home / Self Care | Payer: Self-pay | Source: Ambulatory Visit | Attending: Rehabilitation | Admitting: Rehabilitation

## 2017-10-13 DIAGNOSIS — M4802 Spinal stenosis, cervical region: Secondary | ICD-10-CM | POA: Diagnosis not present

## 2017-10-13 DIAGNOSIS — M542 Cervicalgia: Secondary | ICD-10-CM

## 2017-10-14 DIAGNOSIS — M5012 Mid-cervical disc disorder, unspecified level: Secondary | ICD-10-CM | POA: Diagnosis not present

## 2017-10-14 DIAGNOSIS — M7918 Myalgia, other site: Secondary | ICD-10-CM | POA: Diagnosis not present

## 2017-10-14 DIAGNOSIS — M542 Cervicalgia: Secondary | ICD-10-CM | POA: Diagnosis not present

## 2017-10-14 DIAGNOSIS — M4802 Spinal stenosis, cervical region: Secondary | ICD-10-CM | POA: Diagnosis not present

## 2017-10-20 ENCOUNTER — Encounter (HOSPITAL_COMMUNITY): Payer: Self-pay | Admitting: Psychiatry

## 2017-10-20 ENCOUNTER — Ambulatory Visit (INDEPENDENT_AMBULATORY_CARE_PROVIDER_SITE_OTHER): Payer: Medicare HMO | Admitting: Psychiatry

## 2017-10-20 VITALS — BP 108/73 | HR 89 | Ht 62.0 in | Wt 148.0 lb

## 2017-10-20 DIAGNOSIS — F331 Major depressive disorder, recurrent, moderate: Secondary | ICD-10-CM | POA: Diagnosis not present

## 2017-10-20 MED ORDER — BUPROPION HCL ER (XL) 300 MG PO TB24: 300.0000 mg | ORAL_TABLET | ORAL | 2 refills | Status: DC

## 2017-10-20 MED ORDER — DULOXETINE HCL 60 MG PO CPEP: 60.0000 mg | ORAL_CAPSULE | Freq: Two times a day (BID) | ORAL | 2 refills | Status: DC

## 2017-10-20 MED ORDER — QUETIAPINE FUMARATE 100 MG PO TABS: 100.0000 mg | ORAL_TABLET | Freq: Every day | ORAL | 2 refills | Status: DC
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
## 2017-10-27 DIAGNOSIS — G894 Chronic pain syndrome: Secondary | ICD-10-CM | POA: Diagnosis not present

## 2017-10-27 DIAGNOSIS — M5136 Other intervertebral disc degeneration, lumbar region: Secondary | ICD-10-CM | POA: Diagnosis not present

## 2017-10-27 DIAGNOSIS — Z79899 Other long term (current) drug therapy: Secondary | ICD-10-CM | POA: Diagnosis not present

## 2017-10-27 DIAGNOSIS — M5030 Other cervical disc degeneration, unspecified cervical region: Secondary | ICD-10-CM | POA: Diagnosis not present

## 2017-11-25 DIAGNOSIS — M542 Cervicalgia: Secondary | ICD-10-CM | POA: Diagnosis not present

## 2017-11-25 DIAGNOSIS — M5012 Mid-cervical disc disorder, unspecified level: Secondary | ICD-10-CM | POA: Diagnosis not present

## 2017-11-25 DIAGNOSIS — M4802 Spinal stenosis, cervical region: Secondary | ICD-10-CM | POA: Diagnosis not present

## 2017-11-26 DIAGNOSIS — Z79899 Other long term (current) drug therapy: Secondary | ICD-10-CM | POA: Diagnosis not present

## 2017-11-26 DIAGNOSIS — G894 Chronic pain syndrome: Secondary | ICD-10-CM | POA: Diagnosis not present

## 2017-11-26 DIAGNOSIS — M5030 Other cervical disc degeneration, unspecified cervical region: Secondary | ICD-10-CM | POA: Diagnosis not present

## 2017-11-26 DIAGNOSIS — M5136 Other intervertebral disc degeneration, lumbar region: Secondary | ICD-10-CM | POA: Diagnosis not present

## 2017-12-23 DIAGNOSIS — Z299 Encounter for prophylactic measures, unspecified: Secondary | ICD-10-CM | POA: Diagnosis not present

## 2017-12-23 DIAGNOSIS — Z6825 Body mass index (BMI) 25.0-25.9, adult: Secondary | ICD-10-CM | POA: Diagnosis not present

## 2017-12-23 DIAGNOSIS — R5383 Other fatigue: Secondary | ICD-10-CM | POA: Diagnosis not present

## 2017-12-23 DIAGNOSIS — Z1339 Encounter for screening examination for other mental health and behavioral disorders: Secondary | ICD-10-CM | POA: Diagnosis not present

## 2017-12-23 DIAGNOSIS — Z0000 Encounter for general adult medical examination without abnormal findings: Secondary | ICD-10-CM | POA: Diagnosis not present

## 2017-12-23 DIAGNOSIS — I10 Essential (primary) hypertension: Secondary | ICD-10-CM | POA: Diagnosis not present

## 2017-12-23 DIAGNOSIS — Z7189 Other specified counseling: Secondary | ICD-10-CM | POA: Diagnosis not present

## 2017-12-23 DIAGNOSIS — Z1331 Encounter for screening for depression: Secondary | ICD-10-CM | POA: Diagnosis not present

## 2017-12-23 DIAGNOSIS — E7800 Pure hypercholesterolemia, unspecified: Secondary | ICD-10-CM | POA: Diagnosis not present

## 2017-12-28 DIAGNOSIS — M5030 Other cervical disc degeneration, unspecified cervical region: Secondary | ICD-10-CM | POA: Diagnosis not present

## 2017-12-28 DIAGNOSIS — G894 Chronic pain syndrome: Secondary | ICD-10-CM | POA: Diagnosis not present

## 2017-12-28 DIAGNOSIS — M5136 Other intervertebral disc degeneration, lumbar region: Secondary | ICD-10-CM | POA: Diagnosis not present

## 2017-12-28 DIAGNOSIS — Z79899 Other long term (current) drug therapy: Secondary | ICD-10-CM | POA: Diagnosis not present

## 2018-01-24 ENCOUNTER — Ambulatory Visit (HOSPITAL_COMMUNITY): Payer: Self-pay | Admitting: Psychiatry

## 2018-01-25 DIAGNOSIS — Z79899 Other long term (current) drug therapy: Secondary | ICD-10-CM | POA: Diagnosis not present

## 2018-01-25 DIAGNOSIS — M5030 Other cervical disc degeneration, unspecified cervical region: Secondary | ICD-10-CM | POA: Diagnosis not present

## 2018-01-25 DIAGNOSIS — G894 Chronic pain syndrome: Secondary | ICD-10-CM | POA: Diagnosis not present

## 2018-02-01 ENCOUNTER — Ambulatory Visit (INDEPENDENT_AMBULATORY_CARE_PROVIDER_SITE_OTHER): Payer: Medicare HMO | Admitting: Psychiatry

## 2018-02-01 ENCOUNTER — Encounter (HOSPITAL_COMMUNITY): Payer: Self-pay | Admitting: Psychiatry

## 2018-02-01 VITALS — BP 136/90 | HR 85 | Ht 62.0 in | Wt 147.0 lb

## 2018-02-01 DIAGNOSIS — F331 Major depressive disorder, recurrent, moderate: Secondary | ICD-10-CM | POA: Diagnosis not present

## 2018-02-01 MED ORDER — DULOXETINE HCL 60 MG PO CPEP: 60.0000 mg | ORAL_CAPSULE | Freq: Two times a day (BID) | ORAL | 2 refills | Status: DC

## 2018-02-01 MED ORDER — QUETIAPINE FUMARATE 100 MG PO TABS: 150.0000 mg | ORAL_TABLET | Freq: Every day | ORAL | 2 refills | Status: DC

## 2018-02-01 MED ORDER — QUETIAPINE FUMARATE 25 MG PO TABS: 25.0000 mg | ORAL_TABLET | Freq: Two times a day (BID) | ORAL | 2 refills | Status: DC | PRN

## 2018-02-01 MED ORDER — BUPROPION HCL ER (XL) 300 MG PO TB24: 300.0000 mg | ORAL_TABLET | ORAL | 2 refills | Status: DC
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
## 2018-02-02 DIAGNOSIS — G894 Chronic pain syndrome: Secondary | ICD-10-CM | POA: Diagnosis not present

## 2018-02-02 DIAGNOSIS — Z6826 Body mass index (BMI) 26.0-26.9, adult: Secondary | ICD-10-CM | POA: Diagnosis not present

## 2018-02-02 DIAGNOSIS — I10 Essential (primary) hypertension: Secondary | ICD-10-CM | POA: Diagnosis not present

## 2018-02-02 DIAGNOSIS — Z299 Encounter for prophylactic measures, unspecified: Secondary | ICD-10-CM | POA: Diagnosis not present

## 2018-02-02 DIAGNOSIS — I739 Peripheral vascular disease, unspecified: Secondary | ICD-10-CM | POA: Diagnosis not present

## 2018-02-03 DIAGNOSIS — M4802 Spinal stenosis, cervical region: Secondary | ICD-10-CM | POA: Diagnosis not present

## 2018-02-03 DIAGNOSIS — M542 Cervicalgia: Secondary | ICD-10-CM | POA: Diagnosis not present

## 2018-02-07 DIAGNOSIS — M5010 Cervical disc disorder with radiculopathy, unspecified cervical region: Secondary | ICD-10-CM | POA: Diagnosis not present

## 2018-02-07 DIAGNOSIS — R8290 Unspecified abnormal findings in urine: Secondary | ICD-10-CM | POA: Diagnosis not present

## 2018-02-07 DIAGNOSIS — Z01812 Encounter for preprocedural laboratory examination: Secondary | ICD-10-CM | POA: Diagnosis not present

## 2018-02-07 DIAGNOSIS — R9431 Abnormal electrocardiogram [ECG] [EKG]: Secondary | ICD-10-CM | POA: Diagnosis not present

## 2018-02-07 DIAGNOSIS — Z01810 Encounter for preprocedural cardiovascular examination: Secondary | ICD-10-CM | POA: Diagnosis not present

## 2018-02-07 DIAGNOSIS — I517 Cardiomegaly: Secondary | ICD-10-CM | POA: Diagnosis not present

## 2018-02-08 DIAGNOSIS — M50121 Cervical disc disorder at C4-C5 level with radiculopathy: Secondary | ICD-10-CM | POA: Diagnosis not present

## 2018-02-08 DIAGNOSIS — M50122 Cervical disc disorder at C5-C6 level with radiculopathy: Secondary | ICD-10-CM | POA: Diagnosis not present

## 2018-02-08 DIAGNOSIS — Z981 Arthrodesis status: Secondary | ICD-10-CM | POA: Diagnosis not present

## 2018-02-08 DIAGNOSIS — M5412 Radiculopathy, cervical region: Secondary | ICD-10-CM | POA: Diagnosis not present

## 2018-02-08 DIAGNOSIS — M4712 Other spondylosis with myelopathy, cervical region: Secondary | ICD-10-CM | POA: Diagnosis not present

## 2018-02-08 DIAGNOSIS — M5001 Cervical disc disorder with myelopathy,  high cervical region: Secondary | ICD-10-CM | POA: Diagnosis not present

## 2018-02-08 DIAGNOSIS — M4802 Spinal stenosis, cervical region: Secondary | ICD-10-CM | POA: Diagnosis not present

## 2018-02-08 DIAGNOSIS — M4322 Fusion of spine, cervical region: Secondary | ICD-10-CM | POA: Diagnosis not present

## 2018-02-09 DIAGNOSIS — M4802 Spinal stenosis, cervical region: Secondary | ICD-10-CM | POA: Diagnosis not present

## 2018-02-09 DIAGNOSIS — Z981 Arthrodesis status: Secondary | ICD-10-CM | POA: Diagnosis not present

## 2018-02-09 DIAGNOSIS — M5412 Radiculopathy, cervical region: Secondary | ICD-10-CM | POA: Diagnosis not present

## 2018-04-05 ENCOUNTER — Ambulatory Visit (INDEPENDENT_AMBULATORY_CARE_PROVIDER_SITE_OTHER): Payer: Medicare Other | Admitting: Psychiatry

## 2018-04-05 ENCOUNTER — Encounter (HOSPITAL_COMMUNITY): Payer: Self-pay | Admitting: Psychiatry

## 2018-04-05 VITALS — BP 140/88 | HR 85 | Ht 62.0 in | Wt 142.0 lb

## 2018-04-05 DIAGNOSIS — F331 Major depressive disorder, recurrent, moderate: Secondary | ICD-10-CM | POA: Diagnosis not present

## 2018-04-05 MED ORDER — QUETIAPINE FUMARATE 25 MG PO TABS: 25.0000 mg | ORAL_TABLET | Freq: Two times a day (BID) | ORAL | 2 refills | Status: DC | PRN

## 2018-04-05 MED ORDER — DULOXETINE HCL 60 MG PO CPEP: 60.0000 mg | ORAL_CAPSULE | Freq: Two times a day (BID) | ORAL | 2 refills | Status: DC

## 2018-04-05 MED ORDER — QUETIAPINE FUMARATE 200 MG PO TABS: 200.0000 mg | ORAL_TABLET | Freq: Every day | ORAL | 2 refills | Status: DC

## 2018-04-05 MED ORDER — BUPROPION HCL ER (XL) 300 MG PO TB24: 300.0000 mg | ORAL_TABLET | ORAL | 2 refills | Status: DC
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
## 2018-07-05 ENCOUNTER — Ambulatory Visit (HOSPITAL_COMMUNITY): Payer: Medicare Other | Admitting: Psychiatry

## 2018-07-05 ENCOUNTER — Other Ambulatory Visit: Payer: Self-pay

## 2018-08-18 ENCOUNTER — Encounter (HOSPITAL_COMMUNITY): Payer: Self-pay | Admitting: Psychiatry

## 2018-08-18 ENCOUNTER — Other Ambulatory Visit: Payer: Self-pay

## 2018-08-18 ENCOUNTER — Ambulatory Visit (INDEPENDENT_AMBULATORY_CARE_PROVIDER_SITE_OTHER): Payer: Medicare Other | Admitting: Psychiatry

## 2018-08-18 DIAGNOSIS — F331 Major depressive disorder, recurrent, moderate: Secondary | ICD-10-CM

## 2018-08-18 MED ORDER — BUPROPION HCL ER (XL) 300 MG PO TB24: 300.0000 mg | ORAL_TABLET | ORAL | 2 refills | Status: DC

## 2018-08-18 MED ORDER — QUETIAPINE FUMARATE 200 MG PO TABS: 200.0000 mg | ORAL_TABLET | Freq: Every day | ORAL | 2 refills | Status: DC

## 2018-08-18 MED ORDER — QUETIAPINE FUMARATE 25 MG PO TABS: 25.0000 mg | ORAL_TABLET | Freq: Two times a day (BID) | ORAL | 2 refills | Status: DC | PRN

## 2018-08-18 MED ORDER — DULOXETINE HCL 60 MG PO CPEP: 60.0000 mg | ORAL_CAPSULE | Freq: Two times a day (BID) | ORAL | 2 refills | Status: DC
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
## 2018-11-10 ENCOUNTER — Telehealth (HOSPITAL_COMMUNITY): Payer: Self-pay | Admitting: *Deleted

## 2018-11-10 ENCOUNTER — Other Ambulatory Visit (HOSPITAL_COMMUNITY): Payer: Self-pay | Admitting: Psychiatry

## 2018-11-10 MED ORDER — DULOXETINE HCL 60 MG PO CPEP: 60.0000 mg | ORAL_CAPSULE | Freq: Two times a day (BID) | ORAL | 2 refills | Status: DC

## 2018-11-10 MED ORDER — QUETIAPINE FUMARATE 200 MG PO TABS: 200.0000 mg | ORAL_TABLET | Freq: Every day | ORAL | 2 refills | Status: DC

## 2018-11-10 MED ORDER — QUETIAPINE FUMARATE 25 MG PO TABS: 25.0000 mg | ORAL_TABLET | Freq: Two times a day (BID) | ORAL | 2 refills | Status: DC | PRN

## 2018-11-10 MED ORDER — BUPROPION HCL ER (XL) 300 MG PO TB24: 300.0000 mg | ORAL_TABLET | ORAL | 2 refills | Status: DC

## 2018-11-10 NOTE — Telephone Encounter (Signed)
VERIFIED WITH PATIENT THAT SHE WILL NOW BE USING [facility]. PLEASE SEND ALL MED'S  PREFERRED PHARMACY HAS BEEN UPDATED IN EPIC

## 2018-11-10 NOTE — Telephone Encounter (Signed)
sent 

## 2018-11-16 ENCOUNTER — Telehealth (HOSPITAL_COMMUNITY): Payer: Self-pay | Admitting: *Deleted

## 2018-11-16 NOTE — Telephone Encounter (Signed)
I do NOT prescribe lyrica, she needs to consult PCP

## 2018-11-16 NOTE — Telephone Encounter (Signed)
[facility] has been informed to follow up with PCP

## 2018-11-16 NOTE — Telephone Encounter (Signed)
[facility] called for refill request  For LYRICA 75 MG. I called patient LVM informing that she needs to make a appt for office visit. Last Appt 08/18/2018

## 2019-02-03 ENCOUNTER — Other Ambulatory Visit (HOSPITAL_COMMUNITY): Payer: Self-pay | Admitting: Psychiatry

## 2019-02-03 NOTE — Telephone Encounter (Signed)
Call pt for appt 

## 2019-02-06 NOTE — Telephone Encounter (Signed)
PER PROVIDER: Call pt for appt  HAD TO LVM

## 2019-02-07 ENCOUNTER — Other Ambulatory Visit (HOSPITAL_COMMUNITY): Payer: Self-pay | Admitting: Psychiatry

## 2019-03-31 ENCOUNTER — Encounter (HOSPITAL_COMMUNITY): Payer: Self-pay | Admitting: Psychiatry

## 2019-03-31 ENCOUNTER — Other Ambulatory Visit: Payer: Self-pay

## 2019-03-31 ENCOUNTER — Ambulatory Visit (INDEPENDENT_AMBULATORY_CARE_PROVIDER_SITE_OTHER): Payer: Medicare Other | Admitting: Psychiatry

## 2019-03-31 DIAGNOSIS — F331 Major depressive disorder, recurrent, moderate: Secondary | ICD-10-CM

## 2019-03-31 MED ORDER — QUETIAPINE FUMARATE 25 MG PO TABS: 25.0000 mg | ORAL_TABLET | Freq: Two times a day (BID) | ORAL | 2 refills | Status: DC | PRN

## 2019-03-31 MED ORDER — QUETIAPINE FUMARATE 200 MG PO TABS: 200.0000 mg | ORAL_TABLET | Freq: Every day | ORAL | 2 refills | Status: DC

## 2019-03-31 MED ORDER — DULOXETINE HCL 60 MG PO CPEP: 60.0000 mg | ORAL_CAPSULE | Freq: Two times a day (BID) | ORAL | 2 refills | Status: DC

## 2019-03-31 MED ORDER — BUPROPION HCL ER (XL) 300 MG PO TB24: 300.0000 mg | ORAL_TABLET | ORAL | 2 refills | Status: DC
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
## 2019-04-04 DIAGNOSIS — Z79899 Other long term (current) drug therapy: Secondary | ICD-10-CM | POA: Diagnosis not present

## 2019-04-04 DIAGNOSIS — M5030 Other cervical disc degeneration, unspecified cervical region: Secondary | ICD-10-CM | POA: Diagnosis not present

## 2019-04-04 DIAGNOSIS — G894 Chronic pain syndrome: Secondary | ICD-10-CM | POA: Diagnosis not present

## 2019-04-14 DIAGNOSIS — Z299 Encounter for prophylactic measures, unspecified: Secondary | ICD-10-CM | POA: Diagnosis not present

## 2019-04-14 DIAGNOSIS — I739 Peripheral vascular disease, unspecified: Secondary | ICD-10-CM | POA: Diagnosis not present

## 2019-04-14 DIAGNOSIS — G894 Chronic pain syndrome: Secondary | ICD-10-CM | POA: Diagnosis not present

## 2019-04-14 DIAGNOSIS — F17210 Nicotine dependence, cigarettes, uncomplicated: Secondary | ICD-10-CM | POA: Diagnosis not present

## 2019-04-14 DIAGNOSIS — I10 Essential (primary) hypertension: Secondary | ICD-10-CM | POA: Diagnosis not present

## 2019-05-01 DIAGNOSIS — M7918 Myalgia, other site: Secondary | ICD-10-CM | POA: Diagnosis not present

## 2019-05-01 DIAGNOSIS — M4322 Fusion of spine, cervical region: Secondary | ICD-10-CM | POA: Diagnosis not present

## 2019-05-01 DIAGNOSIS — M1711 Unilateral primary osteoarthritis, right knee: Secondary | ICD-10-CM | POA: Diagnosis not present

## 2019-05-01 DIAGNOSIS — M542 Cervicalgia: Secondary | ICD-10-CM | POA: Diagnosis not present

## 2019-05-02 DIAGNOSIS — Z79899 Other long term (current) drug therapy: Secondary | ICD-10-CM | POA: Diagnosis not present

## 2019-05-02 DIAGNOSIS — M5030 Other cervical disc degeneration, unspecified cervical region: Secondary | ICD-10-CM | POA: Diagnosis not present

## 2019-05-02 DIAGNOSIS — G894 Chronic pain syndrome: Secondary | ICD-10-CM | POA: Diagnosis not present

## 2019-05-05 ENCOUNTER — Encounter: Payer: Self-pay | Admitting: *Deleted

## 2019-05-05 ENCOUNTER — Telehealth: Payer: Self-pay | Admitting: Cardiovascular Disease

## 2019-05-05 ENCOUNTER — Ambulatory Visit (INDEPENDENT_AMBULATORY_CARE_PROVIDER_SITE_OTHER): Payer: Medicare Other | Admitting: Cardiovascular Disease

## 2019-05-05 ENCOUNTER — Other Ambulatory Visit: Payer: Self-pay

## 2019-05-05 VITALS — BP 156/100 | HR 76 | Ht 62.5 in | Wt 159.0 lb

## 2019-05-05 DIAGNOSIS — R079 Chest pain, unspecified: Secondary | ICD-10-CM | POA: Diagnosis not present

## 2019-05-05 DIAGNOSIS — I10 Essential (primary) hypertension: Secondary | ICD-10-CM | POA: Diagnosis not present

## 2019-05-05 DIAGNOSIS — I83893 Varicose veins of bilateral lower extremities with other complications: Secondary | ICD-10-CM

## 2019-05-05 NOTE — Telephone Encounter (Signed)
Pre-cert Verification for the following procedure    Lexiscan & Echo    DATE:05/19/2019  LOCATION:[place]
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
## 2019-05-05 NOTE — Patient Instructions (Addendum)
Medication Instructions:  Continue all current medications.  Labwork: none  Testing/Procedures:  Your physician has requested that you have an echocardiogram. Echocardiography is a painless test that uses sound waves to create images of your heart. It provides your doctor with information about the size and shape of your heart and how well your heart's chambers and valves are working. This procedure takes approximately one hour. There are no restrictions for this procedure.  Your physician has requested that you have a lexiscan myoview. For further information please visit [email or web address]. Please follow instruction sheet, as given.  Office will contact with results via phone or letter.    Follow-Up: 4 months   Any Other Special Instructions Will Be Listed Below (If Applicable). You have been referred to:  Dr. Servando Snare - varicose veins  If you need a refill on your cardiac medications before your next appointment, please call your pharmacy.

## 2019-05-10 ENCOUNTER — Encounter: Payer: Self-pay | Admitting: *Deleted

## 2019-05-19 ENCOUNTER — Ambulatory Visit (HOSPITAL_COMMUNITY)
Admission: RE | Admit: 2019-05-19 | Discharge: 2019-05-19 | Disposition: A | Discharge status: Home / Self Care | Payer: Medicare Other | Source: Ambulatory Visit | Attending: Cardiovascular Disease | Admitting: Cardiovascular Disease

## 2019-05-19 ENCOUNTER — Other Ambulatory Visit: Payer: Self-pay

## 2019-05-19 ENCOUNTER — Encounter (HOSPITAL_COMMUNITY): Payer: Self-pay

## 2019-05-19 ENCOUNTER — Encounter (HOSPITAL_COMMUNITY)
Admission: RE | Admit: 2019-05-19 | Discharge: 2019-05-19 | Disposition: A | Discharge status: Home / Self Care | Payer: Medicare Other | Source: Ambulatory Visit | Attending: Cardiovascular Disease | Admitting: Cardiovascular Disease

## 2019-05-19 ENCOUNTER — Encounter (HOSPITAL_BASED_OUTPATIENT_CLINIC_OR_DEPARTMENT_OTHER)
Admission: RE | Admit: 2019-05-19 | Discharge: 2019-05-19 | Disposition: A | Discharge status: Home / Self Care | Payer: Medicare Other | Source: Ambulatory Visit | Attending: Cardiovascular Disease | Admitting: Cardiovascular Disease

## 2019-05-19 DIAGNOSIS — R079 Chest pain, unspecified: Secondary | ICD-10-CM | POA: Diagnosis not present

## 2019-05-19 LAB — NM MYOCAR MULTI W/SPECT W/WALL MOTION / EF
LV dias vol: 60 mL (ref 46–106)
LV sys vol: 13 mL
Peak HR: 94 {beats}/min
RATE: 0.43
Rest HR: 69 {beats}/min
SDS: 2
SRS: 0
SSS: 2
TID: 1.24

## 2019-05-19 MED ORDER — PERFLUTREN LIPID MICROSPHERE
1.0000 mL | INTRAVENOUS | Status: AC | PRN
  Administered 2019-05-19: 2 mL via INTRAVENOUS
  Filled 2019-05-19: qty 10

## 2019-05-19 MED ORDER — REGADENOSON 0.4 MG/5ML IV SOLN
INTRAVENOUS | Status: AC
  Administered 2019-05-19: 0.4 mg via INTRAVENOUS
  Filled 2019-05-19: qty 5

## 2019-05-19 MED ORDER — SODIUM CHLORIDE FLUSH 0.9 % IV SOLN
INTRAVENOUS | Status: AC
  Administered 2019-05-19: 10 mL via INTRAVENOUS
  Filled 2019-05-19: qty 10

## 2019-05-19 MED ORDER — TECHNETIUM TC 99M TETROFOSMIN IV KIT
30.0000 | PACK | Freq: Once | INTRAVENOUS | Status: AC | PRN
  Administered 2019-05-19: 31 via INTRAVENOUS

## 2019-05-19 MED ORDER — TECHNETIUM TC 99M TETROFOSMIN IV KIT
10.0000 | PACK | Freq: Once | INTRAVENOUS | Status: AC | PRN
  Administered 2019-05-19: 11 via INTRAVENOUS

## 2019-05-19 NOTE — Progress Notes (Signed)
*  PRELIMINARY RESULTS* Echocardiogram 2D Echocardiogram has been performed with Definity per Dr. Harl Bowie.  Samuel Germany 05/19/2019, 11:47 AM

## 2019-06-02 DIAGNOSIS — G894 Chronic pain syndrome: Secondary | ICD-10-CM | POA: Diagnosis not present

## 2019-06-02 DIAGNOSIS — Z79899 Other long term (current) drug therapy: Secondary | ICD-10-CM | POA: Diagnosis not present

## 2019-06-02 DIAGNOSIS — M5030 Other cervical disc degeneration, unspecified cervical region: Secondary | ICD-10-CM | POA: Diagnosis not present

## 2019-06-19 ENCOUNTER — Telehealth (HOSPITAL_COMMUNITY): Payer: Self-pay | Admitting: *Deleted

## 2019-06-19 ENCOUNTER — Other Ambulatory Visit (HOSPITAL_COMMUNITY): Payer: Self-pay | Admitting: Psychiatry

## 2019-06-19 MED ORDER — BUPROPION HCL ER (XL) 300 MG PO TB24: 300.0000 mg | ORAL_TABLET | ORAL | 2 refills | Status: DC

## 2019-06-19 MED ORDER — QUETIAPINE FUMARATE 200 MG PO TABS: 200.0000 mg | ORAL_TABLET | Freq: Every day | ORAL | 2 refills | Status: DC

## 2019-06-19 MED ORDER — QUETIAPINE FUMARATE 25 MG PO TABS: 25.0000 mg | ORAL_TABLET | Freq: Two times a day (BID) | ORAL | 2 refills | Status: DC | PRN

## 2019-06-19 NOTE — Telephone Encounter (Signed)
sent 

## 2019-06-19 NOTE — Telephone Encounter (Signed)
Patient called to inform of Rx change updated in EPIC.  Requested med refill on med's next  appointment is 06/23/19

## 2019-06-23 ENCOUNTER — Encounter (HOSPITAL_COMMUNITY): Payer: Self-pay | Admitting: Psychiatry

## 2019-06-23 ENCOUNTER — Telehealth (INDEPENDENT_AMBULATORY_CARE_PROVIDER_SITE_OTHER): Payer: Medicare Other | Admitting: Psychiatry

## 2019-06-23 ENCOUNTER — Other Ambulatory Visit: Payer: Self-pay

## 2019-06-23 DIAGNOSIS — F331 Major depressive disorder, recurrent, moderate: Secondary | ICD-10-CM

## 2019-06-23 MED ORDER — QUETIAPINE FUMARATE 200 MG PO TABS: 200.0000 mg | ORAL_TABLET | Freq: Every day | ORAL | 2 refills | Status: DC

## 2019-06-23 MED ORDER — DULOXETINE HCL 60 MG PO CPEP: 60.0000 mg | ORAL_CAPSULE | Freq: Two times a day (BID) | ORAL | 2 refills | Status: DC

## 2019-06-23 MED ORDER — BUPROPION HCL ER (XL) 300 MG PO TB24: 300.0000 mg | ORAL_TABLET | ORAL | 2 refills | Status: DC

## 2019-06-23 MED ORDER — QUETIAPINE FUMARATE 25 MG PO TABS: 25.0000 mg | ORAL_TABLET | Freq: Two times a day (BID) | ORAL | 2 refills | Status: DC | PRN
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
## 2019-06-28 ENCOUNTER — Other Ambulatory Visit: Payer: Self-pay | Admitting: *Deleted

## 2019-06-28 ENCOUNTER — Telehealth (HOSPITAL_COMMUNITY): Payer: Self-pay

## 2019-06-28 DIAGNOSIS — I83893 Varicose veins of bilateral lower extremities with other complications: Secondary | ICD-10-CM

## 2019-06-28 NOTE — Telephone Encounter (Signed)

## 2019-06-29 ENCOUNTER — Other Ambulatory Visit: Payer: Self-pay

## 2019-06-29 ENCOUNTER — Ambulatory Visit (HOSPITAL_COMMUNITY)
Admission: RE | Admit: 2019-06-29 | Discharge: 2019-06-29 | Disposition: A | Discharge status: Home / Self Care | Payer: Medicare Other | Source: Ambulatory Visit | Attending: Vascular Surgery | Admitting: Vascular Surgery

## 2019-06-29 ENCOUNTER — Encounter: Payer: Self-pay | Admitting: Vascular Surgery

## 2019-06-29 ENCOUNTER — Ambulatory Visit (INDEPENDENT_AMBULATORY_CARE_PROVIDER_SITE_OTHER): Payer: Medicare Other | Admitting: Vascular Surgery

## 2019-06-29 ENCOUNTER — Telehealth (HOSPITAL_COMMUNITY): Payer: Medicare Other | Admitting: Psychiatry

## 2019-06-29 VITALS — BP 128/84 | HR 69 | Temp 97.6°F | Resp 18 | Ht 63.0 in | Wt 159.4 lb

## 2019-06-29 DIAGNOSIS — I83813 Varicose veins of bilateral lower extremities with pain: Secondary | ICD-10-CM | POA: Diagnosis not present

## 2019-06-29 DIAGNOSIS — I83893 Varicose veins of bilateral lower extremities with other complications: Secondary | ICD-10-CM | POA: Diagnosis not present
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
## 2019-07-04 DIAGNOSIS — M542 Cervicalgia: Secondary | ICD-10-CM | POA: Diagnosis not present

## 2019-07-04 DIAGNOSIS — F17210 Nicotine dependence, cigarettes, uncomplicated: Secondary | ICD-10-CM | POA: Diagnosis not present

## 2019-07-04 DIAGNOSIS — Z79899 Other long term (current) drug therapy: Secondary | ICD-10-CM | POA: Diagnosis not present

## 2019-07-04 DIAGNOSIS — G894 Chronic pain syndrome: Secondary | ICD-10-CM | POA: Diagnosis not present

## 2019-08-04 DIAGNOSIS — F17210 Nicotine dependence, cigarettes, uncomplicated: Secondary | ICD-10-CM | POA: Diagnosis not present

## 2019-08-04 DIAGNOSIS — M542 Cervicalgia: Secondary | ICD-10-CM | POA: Diagnosis not present

## 2019-08-04 DIAGNOSIS — Z79899 Other long term (current) drug therapy: Secondary | ICD-10-CM | POA: Diagnosis not present

## 2019-08-04 DIAGNOSIS — G894 Chronic pain syndrome: Secondary | ICD-10-CM | POA: Diagnosis not present

## 2019-08-07 DIAGNOSIS — M1711 Unilateral primary osteoarthritis, right knee: Secondary | ICD-10-CM | POA: Diagnosis not present

## 2019-08-07 DIAGNOSIS — M4322 Fusion of spine, cervical region: Secondary | ICD-10-CM | POA: Diagnosis not present

## 2019-08-07 DIAGNOSIS — M542 Cervicalgia: Secondary | ICD-10-CM | POA: Diagnosis not present

## 2019-08-07 DIAGNOSIS — M7918 Myalgia, other site: Secondary | ICD-10-CM | POA: Diagnosis not present

## 2019-08-25 ENCOUNTER — Ambulatory Visit: Payer: Medicare Other | Admitting: Cardiovascular Disease
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
## 2019-09-01 ENCOUNTER — Encounter: Payer: Self-pay | Admitting: Family Medicine

## 2019-09-01 ENCOUNTER — Other Ambulatory Visit: Payer: Self-pay

## 2019-09-01 ENCOUNTER — Ambulatory Visit (INDEPENDENT_AMBULATORY_CARE_PROVIDER_SITE_OTHER): Payer: Medicare Other | Admitting: Family Medicine

## 2019-09-01 VITALS — BP 134/84 | HR 78 | Ht 63.0 in | Wt 161.0 lb

## 2019-09-01 DIAGNOSIS — M79605 Pain in left leg: Secondary | ICD-10-CM | POA: Diagnosis not present

## 2019-09-01 DIAGNOSIS — R600 Localized edema: Secondary | ICD-10-CM

## 2019-09-01 DIAGNOSIS — I10 Essential (primary) hypertension: Secondary | ICD-10-CM

## 2019-09-01 DIAGNOSIS — M79604 Pain in right leg: Secondary | ICD-10-CM

## 2019-09-01 DIAGNOSIS — R079 Chest pain, unspecified: Secondary | ICD-10-CM

## 2019-09-01 NOTE — Patient Instructions (Addendum)
Medication Instructions:  Continue all current medications.   Labwork: none  Testing/Procedures: none  Follow-Up: 6 months   Any Other Special Instructions Will Be Listed Below (If Applicable).   If you need a refill on your cardiac medications before your next appointment, please call your pharmacy.  

## 2019-09-06 DIAGNOSIS — M542 Cervicalgia: Secondary | ICD-10-CM | POA: Diagnosis not present

## 2019-09-06 DIAGNOSIS — G894 Chronic pain syndrome: Secondary | ICD-10-CM | POA: Diagnosis not present

## 2019-09-06 DIAGNOSIS — Z79899 Other long term (current) drug therapy: Secondary | ICD-10-CM | POA: Diagnosis not present

## 2019-10-05 ENCOUNTER — Ambulatory Visit: Payer: Medicare Other | Admitting: Vascular Surgery

## 2019-10-05 DIAGNOSIS — M4322 Fusion of spine, cervical region: Secondary | ICD-10-CM | POA: Diagnosis not present

## 2019-10-05 DIAGNOSIS — M542 Cervicalgia: Secondary | ICD-10-CM | POA: Diagnosis not present

## 2019-10-05 DIAGNOSIS — M7918 Myalgia, other site: Secondary | ICD-10-CM | POA: Diagnosis not present

## 2019-10-06 DIAGNOSIS — M5030 Other cervical disc degeneration, unspecified cervical region: Secondary | ICD-10-CM | POA: Diagnosis not present

## 2019-10-06 DIAGNOSIS — G894 Chronic pain syndrome: Secondary | ICD-10-CM | POA: Diagnosis not present

## 2019-10-06 DIAGNOSIS — Z79899 Other long term (current) drug therapy: Secondary | ICD-10-CM | POA: Diagnosis not present

## 2019-10-19 ENCOUNTER — Ambulatory Visit (INDEPENDENT_AMBULATORY_CARE_PROVIDER_SITE_OTHER): Payer: Medicare Other | Admitting: Vascular Surgery

## 2019-10-19 ENCOUNTER — Other Ambulatory Visit: Payer: Self-pay

## 2019-10-19 ENCOUNTER — Encounter: Payer: Self-pay | Admitting: Vascular Surgery

## 2019-10-19 VITALS — BP 124/90 | HR 80 | Temp 97.3°F | Resp 16 | Ht 63.0 in | Wt 154.7 lb

## 2019-10-19 DIAGNOSIS — I83813 Varicose veins of bilateral lower extremities with pain: Secondary | ICD-10-CM
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
## 2019-10-25 ENCOUNTER — Encounter (HOSPITAL_COMMUNITY): Payer: Self-pay | Admitting: Psychiatry

## 2019-10-25 ENCOUNTER — Telehealth (INDEPENDENT_AMBULATORY_CARE_PROVIDER_SITE_OTHER): Payer: Medicare Other | Admitting: Psychiatry

## 2019-10-25 ENCOUNTER — Other Ambulatory Visit: Payer: Self-pay

## 2019-10-25 DIAGNOSIS — F331 Major depressive disorder, recurrent, moderate: Secondary | ICD-10-CM

## 2019-10-25 MED ORDER — BUPROPION HCL ER (XL) 300 MG PO TB24: 300.0000 mg | ORAL_TABLET | ORAL | 2 refills | Status: DC

## 2019-10-25 MED ORDER — CLONAZEPAM 0.5 MG PO TABS: 0.2500 mg | ORAL_TABLET | Freq: Two times a day (BID) | ORAL | 2 refills | Status: DC | PRN

## 2019-10-25 MED ORDER — DULOXETINE HCL 60 MG PO CPEP: 60.0000 mg | ORAL_CAPSULE | Freq: Two times a day (BID) | ORAL | 2 refills | Status: DC

## 2019-10-25 MED ORDER — QUETIAPINE FUMARATE 200 MG PO TABS: 200.0000 mg | ORAL_TABLET | Freq: Every day | ORAL | 2 refills | Status: DC
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
## 2019-11-02 DIAGNOSIS — R609 Edema, unspecified: Secondary | ICD-10-CM | POA: Diagnosis not present

## 2019-11-02 DIAGNOSIS — R600 Localized edema: Secondary | ICD-10-CM | POA: Diagnosis not present

## 2019-11-02 DIAGNOSIS — R079 Chest pain, unspecified: Secondary | ICD-10-CM | POA: Diagnosis not present

## 2019-11-02 DIAGNOSIS — Z91041 Radiographic dye allergy status: Secondary | ICD-10-CM | POA: Diagnosis not present

## 2019-11-02 DIAGNOSIS — M79604 Pain in right leg: Secondary | ICD-10-CM | POA: Diagnosis not present

## 2019-11-02 DIAGNOSIS — Z79899 Other long term (current) drug therapy: Secondary | ICD-10-CM | POA: Diagnosis not present

## 2019-11-02 DIAGNOSIS — I10 Essential (primary) hypertension: Secondary | ICD-10-CM | POA: Diagnosis not present

## 2019-11-02 DIAGNOSIS — M79605 Pain in left leg: Secondary | ICD-10-CM | POA: Diagnosis not present

## 2019-11-02 DIAGNOSIS — F17200 Nicotine dependence, unspecified, uncomplicated: Secondary | ICD-10-CM | POA: Diagnosis not present

## 2019-11-02 DIAGNOSIS — K219 Gastro-esophageal reflux disease without esophagitis: Secondary | ICD-10-CM | POA: Diagnosis not present

## 2019-11-03 ENCOUNTER — Telehealth: Payer: Self-pay

## 2019-11-03 NOTE — Telephone Encounter (Signed)
Pt called with c/o swelling in legs and R knee pain. I offered to make her an appt next week with PA but she prefers to be seen by Scot Dock. He is off next week and she is aware and would rather wait. She was experiencing sob and was in the ED last night but left after waiting for hours. They did an u/s of her legs but she never spoke to anyone to get results. I have encouraged her to get seen today by her PCP or go to ED if she is sob. She verbalized understanding.

## 2019-11-06 DIAGNOSIS — I10 Essential (primary) hypertension: Secondary | ICD-10-CM | POA: Diagnosis not present

## 2019-11-06 DIAGNOSIS — G894 Chronic pain syndrome: Secondary | ICD-10-CM | POA: Diagnosis not present

## 2019-11-06 DIAGNOSIS — F17210 Nicotine dependence, cigarettes, uncomplicated: Secondary | ICD-10-CM | POA: Diagnosis not present

## 2019-11-06 DIAGNOSIS — Z299 Encounter for prophylactic measures, unspecified: Secondary | ICD-10-CM | POA: Diagnosis not present

## 2019-11-06 DIAGNOSIS — R600 Localized edema: Secondary | ICD-10-CM | POA: Diagnosis not present

## 2019-11-06 DIAGNOSIS — Z79899 Other long term (current) drug therapy: Secondary | ICD-10-CM | POA: Diagnosis not present

## 2019-11-06 DIAGNOSIS — M5030 Other cervical disc degeneration, unspecified cervical region: Secondary | ICD-10-CM | POA: Diagnosis not present

## 2019-11-07 ENCOUNTER — Other Ambulatory Visit: Payer: Self-pay | Admitting: *Deleted

## 2019-11-07 DIAGNOSIS — I83811 Varicose veins of right lower extremities with pain: Secondary | ICD-10-CM

## 2019-11-13 ENCOUNTER — Encounter: Payer: Self-pay | Admitting: Vascular Surgery

## 2019-11-15 ENCOUNTER — Other Ambulatory Visit: Payer: Self-pay

## 2019-11-15 DIAGNOSIS — I83893 Varicose veins of bilateral lower extremities with other complications: Secondary | ICD-10-CM

## 2019-11-16 ENCOUNTER — Other Ambulatory Visit: Payer: Self-pay | Admitting: *Deleted

## 2019-11-16 DIAGNOSIS — F411 Generalized anxiety disorder: Secondary | ICD-10-CM

## 2019-11-16 MED ORDER — LORAZEPAM 1 MG PO TABS: ORAL_TABLET | ORAL | 0 refills | Status: DC

## 2019-11-23 ENCOUNTER — Other Ambulatory Visit: Payer: Self-pay

## 2019-11-23 ENCOUNTER — Encounter: Payer: Self-pay | Admitting: Vascular Surgery

## 2019-11-23 ENCOUNTER — Ambulatory Visit (INDEPENDENT_AMBULATORY_CARE_PROVIDER_SITE_OTHER): Payer: Medicare Other | Admitting: Vascular Surgery

## 2019-11-23 VITALS — BP 101/75 | HR 82 | Temp 97.2°F | Resp 16 | Ht 63.0 in | Wt 160.0 lb

## 2019-11-23 DIAGNOSIS — I83811 Varicose veins of right lower extremities with pain: Secondary | ICD-10-CM | POA: Diagnosis not present

## 2019-11-23 HISTORY — PX: ENDOVENOUS ABLATION SAPHENOUS VEIN W/ LASER: SUR449

## 2019-11-23 NOTE — Progress Notes (Signed)
     Laser Ablation Procedure    Date: 11/23/2019   Hayley Juarez DOB:03-04-1960  Consent signed: Yes      Surgeon: Gae Gallop  MD  Procedure: Laser Ablation: right Greater Saphenous Vein  BP 101/75 (BP Location: Left Arm, Patient Position: Sitting, Cuff Size: Large)   Pulse 82   Temp (!) 97.2 F (36.2 C) (Temporal)   Resp 16   Ht 5\' 3"  (1.6 m)   Wt 160 lb (72.6 kg)   SpO2 98%   BMI 28.34 kg/m   Tumescent Anesthesia: 500 cc 0.9% NaCl with 50 cc Lidocaine HCL 1%  and 15 cc 8.4% NaHCO3  Local Anesthesia: 7 cc Lidocaine HCL and NaHCO3 (ratio 2:1)  7 watts continuous mode     Total energy: 856 JOULES    Total time: 122 SECONDS Treatment Length  18 CM  Laser Fiber Ref. #   [id number]                         Lot #  [id number]   Stab Phlebectomy: 10-20 Sites: Thigh and Calf  Patient tolerated procedure well  Notes: Patient wore face mask.  All staff members wore facial masks and facial shields/goggles.   Patient took Ativan 1 mg orally on 11/23/2019 @ 7:45 am and 1 mg @ 8:25 am  Description of Procedure:  After marking the course of the secondary varicosities, the patient was placed on the operating table in the supine position, and the right leg was prepped and draped in sterile fashion.   Local anesthetic was administered and under ultrasound guidance the saphenous vein was accessed with a micro needle and guide wire; then the mirco puncture sheath was placed.  A guide wire was inserted saphenofemoral junction , followed by a 5 french sheath.  The position of the sheath and then the laser fiber below the junction was confirmed using the ultrasound.  Tumescent anesthesia was administered along the course of the saphenous vein using ultrasound guidance. The patient was placed in Trendelenburg position and protective laser glasses were placed on patient and staff, and the laser was fired at 7 watts continuous mode for a total of 856 joules.   For stab phlebectomies, local  anesthetic was administered at the previously marked varicosities, and tumescent anesthesia was administered around the vessels.  Ten to 20 stab wounds were made using the tip of an 11 blade. And using the vein hook, the phlebectomies were performed using a hemostat to avulse the varicosities.  Adequate hemostasis was achieved.     Steri strips were applied to the stab wounds and ABD pads and thigh high compression stockings were applied.  Ace wrap bandages were applied over the phlebectomy sites and at the top of the saphenofemoral junction. Blood loss was less than 15 cc.  Discharge instructions reviewed with patient and hardcopy of discharge instructions given to patient to take home. The patient ambulated out of the operating room having tolerated the procedure well.

## 2019-11-23 NOTE — Progress Notes (Signed)
Patient name: Hayley Juarez MRN: [id number] DOB: December 08, 1960 Sex: female  REASON FOR VISIT: For laser ablation of the right great saphenous vein with 10-20 stabs  HPI: Hayley Juarez is a 59 y.o. female who presented with painful varicose veins of the right lower extremity.  She had significant reflux in the right great saphenous vein in the proximal thigh.  Just above the mid thigh this gives off a large tributary and she has large varicose veins in the medial thigh and calf which are under significant pressure.  She failed conservative treatment including leg elevation, thigh-high compression stockings, and ibuprofen as needed for pain.  She was felt to be a good candidate for laser ablation of the right great saphenous vein with 10-20 stabs.  She comes in today for that procedure.  Current Outpatient Medications  Medication Sig Dispense Refill  . amLODipine (NORVASC) 10 MG tablet Take 10 mg by mouth daily.    . Baclofen 5 MG TABS TAKE 1 TABLET BY MOUTH TWICE DAILY AS NEEDED (NO MORE ZANAFLEX)  0  . buPROPion (WELLBUTRIN XL) 300 MG 24 hr tablet Take 1 tablet (300 mg total) by mouth every morning. 90 tablet 2  . calcium carbonate (CALCIUM 600) 600 MG TABS tablet Take 600 mg by mouth 2 (two) times daily.    . clonazePAM (KLONOPIN) 0.5 MG tablet Take 0.5 tablets (0.25 mg total) by mouth 2 (two) times daily as needed for anxiety. 30 tablet 2  . Coenzyme Q10 (CO Q-10 PO) Take 1 capsule by mouth daily.     . DOCOSAHEXAENOIC ACID PO Take 1 g by mouth daily. Fish Oil    . DULoxetine (CYMBALTA) 60 MG capsule Take 1 capsule (60 mg total) by mouth 2 (two) times daily. 180 capsule 2  . Ergocalciferol (VITAMIN D2) 400 units TABS Take 400 mg by mouth daily.    Marland Kitchen lisinopril (ZESTRIL) 40 MG tablet Take 40 mg by mouth daily.    Marland Kitchen LORazepam (ATIVAN) 1 MG tablet Take 1 tablet 30 minutes prior to leaving house on day of procedure and bring second tablet with you to the office. 2 tablet 0  . metoprolol  tartrate (LOPRESSOR) 25 MG tablet Take 25 mg by mouth 2 (two) times daily.    Marland Kitchen oxyCODONE-acetaminophen (PERCOCET) 7.5-325 MG tablet Take 1 tablet by mouth every 8 (eight) hours as needed.     . pregabalin (LYRICA) 75 MG capsule Take 75 mg by mouth 2 (two) times daily.     . QUEtiapine (SEROQUEL) 200 MG tablet Take 1 tablet (200 mg total) by mouth at bedtime. 90 tablet 2  . QUEtiapine (SEROQUEL) 25 MG tablet Take 1 tablet (25 mg total) by mouth 2 (two) times daily as needed. 180 tablet 2  . rosuvastatin (CRESTOR) 5 MG tablet Take 5 mg by mouth daily.     No current facility-administered medications for this visit.    PHYSICAL EXAM: Vitals:   11/23/19 0849  BP: 101/75  Pulse: 82  Resp: 16  Temp: (!) 97.2 F (36.2 C)  TempSrc: Temporal  SpO2: 98%  Weight: 160 lb (72.6 kg)  Height: 5\' 3"  (1.6 m)    PROCEDURE: Laser ablation of the right great saphenous vein with 10-20 stab phlebectomies  TECHNIQUE: The patient was taken to the exam room and her dilated varicose veins were marked with the patient standing.  The patient was then placed supine.  I looked at the great saphenous vein myself with the SonoSite and it looks  like we could cannulate this just above the mid thigh where it gives off a large tributary.  The right leg was prepped and draped in usual sterile fashion.  Under ultrasound guidance, after the skin was anesthetized, I cannulated the great saphenous vein just below the takeoff of a large tributary in the mid thigh.  Micropuncture sheath was introduced over a wire.  I then advanced the J-wire to just below the saphenofemoral junction.  A 45 cm sheath was advanced over the wire and the wire and dilator removed.  I then advanced the laser fiber to the end of the sheath and the sheath was then retracted.  I position the end of the laser fiber 2.5 cm distal to the saphenofemoral junction and below the takeoff of the superficial epigastric vein.  Tumescent anesthesia was then  administered circumferentially around the vein.  The patient was then placed in Trendelenburg.  Laser glasses were placed and we did not perform laser ablation of the left great saphenous vein from 2.5 cm distal to the saphenofemoral junction to just above the mid thigh.  Next all the areas with marked veins were anesthetized with tumescent anesthesia.  Approximately 15 small stab incisions were made with an 11 blade.  The vein was teased above the skin with the hook and then gently removed using a hemostat.  Pressure was held for hemostasis.  Pressure dressing was applied.  Patient tolerated the procedure well.  She will return in 1 week for a follow-up duplex.     [name] [facility] [phone]

## 2019-11-30 ENCOUNTER — Ambulatory Visit (INDEPENDENT_AMBULATORY_CARE_PROVIDER_SITE_OTHER): Payer: Medicare Other | Admitting: Vascular Surgery

## 2019-11-30 ENCOUNTER — Other Ambulatory Visit: Payer: Self-pay

## 2019-11-30 ENCOUNTER — Ambulatory Visit (HOSPITAL_COMMUNITY)
Admission: RE | Admit: 2019-11-30 | Discharge: 2019-11-30 | Disposition: A | Discharge status: Home / Self Care | Payer: Medicare Other | Source: Ambulatory Visit | Attending: Vascular Surgery | Admitting: Vascular Surgery

## 2019-11-30 ENCOUNTER — Encounter: Payer: Self-pay | Admitting: Vascular Surgery

## 2019-11-30 VITALS — BP 147/95 | HR 77 | Temp 97.2°F | Resp 16 | Ht 63.0 in | Wt 156.0 lb

## 2019-11-30 DIAGNOSIS — I83811 Varicose veins of right lower extremities with pain: Secondary | ICD-10-CM

## 2019-11-30 DIAGNOSIS — Z48812 Encounter for surgical aftercare following surgery on the circulatory system: Secondary | ICD-10-CM

## 2019-11-30 DIAGNOSIS — I83893 Varicose veins of bilateral lower extremities with other complications: Secondary | ICD-10-CM

## 2019-11-30 NOTE — Progress Notes (Signed)
Patient name: Hayley Juarez MRN: [id number] DOB: 15-Dec-1960 Sex: female  REASON FOR VISIT: Follow-up after endovenous laser ablation of the right great saphenous vein  HPI: Hayley Juarez is a 59 y.o. female who presented with painful varicose veins in the right lower extremity.  She had significant reflux in the right great saphenous vein in the proximal thigh.  Just above the mid thigh this gave off a large tributary with large varicose veins in the medial thigh and calf.  She failed conservative treatment and underwent laser ablation of the right great saphenous vein with 10-20 stabs on 11/23/2019.  She comes in for a 1 week follow-up visit.  The patient complains of some stabbing pain around her knee.  She has had mild swelling and mild bruising.  She has not been ambulating much but I encouraged her to ambulate more.  She has been elevating her legs some.  She denies chest pain or shortness of breath.  Current Outpatient Medications  Medication Sig Dispense Refill  . amLODipine (NORVASC) 10 MG tablet Take 10 mg by mouth daily.    . Baclofen 5 MG TABS TAKE 1 TABLET BY MOUTH TWICE DAILY AS NEEDED (NO MORE ZANAFLEX)  0  . buPROPion (WELLBUTRIN XL) 300 MG 24 hr tablet Take 1 tablet (300 mg total) by mouth every morning. 90 tablet 2  . calcium carbonate (CALCIUM 600) 600 MG TABS tablet Take 600 mg by mouth 2 (two) times daily.    . clonazePAM (KLONOPIN) 0.5 MG tablet Take 0.5 tablets (0.25 mg total) by mouth 2 (two) times daily as needed for anxiety. 30 tablet 2  . Coenzyme Q10 (CO Q-10 PO) Take 1 capsule by mouth daily.     . DOCOSAHEXAENOIC ACID PO Take 1 g by mouth daily. Fish Oil    . DULoxetine (CYMBALTA) 60 MG capsule Take 1 capsule (60 mg total) by mouth 2 (two) times daily. 180 capsule 2  . Ergocalciferol (VITAMIN D2) 400 units TABS Take 400 mg by mouth daily.    . furosemide (LASIX) 20 MG tablet Take 20 mg by mouth daily.    Marland Kitchen lisinopril (ZESTRIL) 40 MG tablet Take 40 mg by  mouth daily.    Marland Kitchen LORazepam (ATIVAN) 1 MG tablet Take 1 tablet 30 minutes prior to leaving house on day of procedure and bring second tablet with you to the office. 2 tablet 0  . metoprolol tartrate (LOPRESSOR) 25 MG tablet Take 25 mg by mouth 2 (two) times daily.    Marland Kitchen oxyCODONE-acetaminophen (PERCOCET) 7.5-325 MG tablet Take 1 tablet by mouth every 8 (eight) hours as needed.     . pregabalin (LYRICA) 75 MG capsule Take 75 mg by mouth 2 (two) times daily.     . QUEtiapine (SEROQUEL) 200 MG tablet Take 1 tablet (200 mg total) by mouth at bedtime. 90 tablet 2  . rosuvastatin (CRESTOR) 5 MG tablet Take 5 mg by mouth daily.    . QUEtiapine (SEROQUEL) 25 MG tablet Take 1 tablet (25 mg total) by mouth 2 (two) times daily as needed. (Patient not taking: Reported on 11/30/2019) 180 tablet 2   No current facility-administered medications for this visit.   REVIEW OF SYSTEMS: Valu.Nieves ] denotes positive finding; [  ] denotes negative finding  CARDIOVASCULAR:  [ ]  chest pain   [ ]  dyspnea on exertion  [ ]  leg swelling  CONSTITUTIONAL:  [ ]  fever   [ ]  chills  PHYSICAL EXAM: Vitals:   11/30/19 1004  BP: (!) 147/95  Pulse: 77  Resp: 16  Temp: (!) 97.2 F (36.2 C)  TempSrc: Temporal  SpO2: 100%  Weight: 156 lb (70.8 kg)  Height: 5\' 3"  (1.6 m)   GENERAL: The patient is a well-nourished female, in no acute distress. The vital signs are documented above. CARDIOVASCULAR: There is a regular rate and rhythm. PULMONARY: There is good air exchange bilaterally without wheezing or rales. VASCULAR: Her incisions are all healing nicely.  She has mild swelling in the thigh.  There is minimal bruising.  DATA:  VENOUS DUPLEX: I have independently interpreted her venous duplex scan today.  There is no evidence of DVT.  The vein is successfully closed to within 0.3 cm of the saphenofemoral junction.  MEDICAL ISSUES:  S/P ENDOVENOUS LASER ABLATION RIGHT GREAT SAPHENOUS VEIN: The patient is doing well status post  laser ablation of the right great saphenous vein with 10-20 stabs.  I have encouraged her to continue to wear her thigh-high stocking for another week and then she can transition to a knee-high stocking if that is more practical.  I have encouraged her to ambulate.  We also discussed the importance of leg elevation.  I will see her back in 2 months so we can reevaluate her symptoms post treatment.  She knows to call sooner if she has problems.  [name] [facility] [place] [phone]

## 2019-12-06 DIAGNOSIS — Z79899 Other long term (current) drug therapy: Secondary | ICD-10-CM | POA: Diagnosis not present

## 2019-12-06 DIAGNOSIS — M5030 Other cervical disc degeneration, unspecified cervical region: Secondary | ICD-10-CM | POA: Diagnosis not present

## 2019-12-06 DIAGNOSIS — G894 Chronic pain syndrome: Secondary | ICD-10-CM | POA: Diagnosis not present

## 2019-12-21 ENCOUNTER — Other Ambulatory Visit: Payer: Medicare Other | Admitting: Vascular Surgery

## 2019-12-22 DIAGNOSIS — R911 Solitary pulmonary nodule: Secondary | ICD-10-CM | POA: Diagnosis not present

## 2019-12-22 DIAGNOSIS — R1084 Generalized abdominal pain: Secondary | ICD-10-CM | POA: Diagnosis not present

## 2019-12-22 DIAGNOSIS — M25461 Effusion, right knee: Secondary | ICD-10-CM | POA: Diagnosis not present

## 2019-12-22 DIAGNOSIS — M25562 Pain in left knee: Secondary | ICD-10-CM | POA: Diagnosis not present

## 2019-12-22 DIAGNOSIS — R918 Other nonspecific abnormal finding of lung field: Secondary | ICD-10-CM | POA: Diagnosis not present

## 2019-12-22 DIAGNOSIS — I700 Atherosclerosis of aorta: Secondary | ICD-10-CM | POA: Diagnosis not present

## 2019-12-22 DIAGNOSIS — E876 Hypokalemia: Secondary | ICD-10-CM | POA: Diagnosis not present

## 2019-12-22 DIAGNOSIS — M545 Low back pain, unspecified: Secondary | ICD-10-CM | POA: Diagnosis not present

## 2019-12-22 DIAGNOSIS — F17210 Nicotine dependence, cigarettes, uncomplicated: Secondary | ICD-10-CM | POA: Diagnosis not present

## 2019-12-22 DIAGNOSIS — K429 Umbilical hernia without obstruction or gangrene: Secondary | ICD-10-CM | POA: Diagnosis not present

## 2019-12-22 DIAGNOSIS — R109 Unspecified abdominal pain: Secondary | ICD-10-CM | POA: Diagnosis not present

## 2019-12-22 DIAGNOSIS — M1611 Unilateral primary osteoarthritis, right hip: Secondary | ICD-10-CM | POA: Diagnosis not present

## 2019-12-22 DIAGNOSIS — M1711 Unilateral primary osteoarthritis, right knee: Secondary | ICD-10-CM | POA: Diagnosis not present

## 2019-12-28 ENCOUNTER — Ambulatory Visit: Payer: Medicare Other | Admitting: Vascular Surgery

## 2019-12-28 ENCOUNTER — Encounter (HOSPITAL_COMMUNITY): Payer: Medicare Other

## 2020-01-05 DIAGNOSIS — M5030 Other cervical disc degeneration, unspecified cervical region: Secondary | ICD-10-CM | POA: Diagnosis not present

## 2020-01-05 DIAGNOSIS — G894 Chronic pain syndrome: Secondary | ICD-10-CM | POA: Diagnosis not present

## 2020-01-05 DIAGNOSIS — Z79899 Other long term (current) drug therapy: Secondary | ICD-10-CM | POA: Diagnosis not present

## 2020-01-11 DIAGNOSIS — M791 Myalgia, unspecified site: Secondary | ICD-10-CM | POA: Diagnosis not present

## 2020-01-11 DIAGNOSIS — F17210 Nicotine dependence, cigarettes, uncomplicated: Secondary | ICD-10-CM | POA: Diagnosis not present

## 2020-01-11 DIAGNOSIS — Z299 Encounter for prophylactic measures, unspecified: Secondary | ICD-10-CM | POA: Diagnosis not present

## 2020-01-11 DIAGNOSIS — I10 Essential (primary) hypertension: Secondary | ICD-10-CM | POA: Diagnosis not present

## 2020-01-11 DIAGNOSIS — I509 Heart failure, unspecified: Secondary | ICD-10-CM | POA: Diagnosis not present

## 2020-01-15 DIAGNOSIS — Z0000 Encounter for general adult medical examination without abnormal findings: Secondary | ICD-10-CM | POA: Diagnosis not present

## 2020-01-15 DIAGNOSIS — Z7189 Other specified counseling: Secondary | ICD-10-CM | POA: Diagnosis not present

## 2020-01-15 DIAGNOSIS — F17210 Nicotine dependence, cigarettes, uncomplicated: Secondary | ICD-10-CM | POA: Diagnosis not present

## 2020-01-15 DIAGNOSIS — E7800 Pure hypercholesterolemia, unspecified: Secondary | ICD-10-CM | POA: Diagnosis not present

## 2020-01-15 DIAGNOSIS — Z299 Encounter for prophylactic measures, unspecified: Secondary | ICD-10-CM | POA: Diagnosis not present

## 2020-01-15 DIAGNOSIS — Z79899 Other long term (current) drug therapy: Secondary | ICD-10-CM | POA: Diagnosis not present

## 2020-01-15 DIAGNOSIS — I10 Essential (primary) hypertension: Secondary | ICD-10-CM | POA: Diagnosis not present

## 2020-01-15 DIAGNOSIS — R5383 Other fatigue: Secondary | ICD-10-CM | POA: Diagnosis not present

## 2020-01-16 DIAGNOSIS — F17210 Nicotine dependence, cigarettes, uncomplicated: Secondary | ICD-10-CM | POA: Diagnosis not present

## 2020-01-16 DIAGNOSIS — Z7901 Long term (current) use of anticoagulants: Secondary | ICD-10-CM | POA: Diagnosis not present

## 2020-01-16 DIAGNOSIS — R2689 Other abnormalities of gait and mobility: Secondary | ICD-10-CM | POA: Diagnosis not present

## 2020-01-16 DIAGNOSIS — K219 Gastro-esophageal reflux disease without esophagitis: Secondary | ICD-10-CM | POA: Diagnosis not present

## 2020-01-16 DIAGNOSIS — M7989 Other specified soft tissue disorders: Secondary | ICD-10-CM | POA: Diagnosis not present

## 2020-01-16 DIAGNOSIS — Z7982 Long term (current) use of aspirin: Secondary | ICD-10-CM | POA: Diagnosis not present

## 2020-01-16 DIAGNOSIS — I82401 Acute embolism and thrombosis of unspecified deep veins of right lower extremity: Secondary | ICD-10-CM | POA: Diagnosis not present

## 2020-01-16 DIAGNOSIS — G8929 Other chronic pain: Secondary | ICD-10-CM | POA: Diagnosis not present

## 2020-01-16 DIAGNOSIS — E876 Hypokalemia: Secondary | ICD-10-CM | POA: Diagnosis not present

## 2020-01-16 DIAGNOSIS — M79604 Pain in right leg: Secondary | ICD-10-CM | POA: Diagnosis not present

## 2020-01-16 DIAGNOSIS — I82491 Acute embolism and thrombosis of other specified deep vein of right lower extremity: Secondary | ICD-10-CM | POA: Diagnosis not present

## 2020-01-16 DIAGNOSIS — I82811 Embolism and thrombosis of superficial veins of right lower extremities: Secondary | ICD-10-CM | POA: Diagnosis not present

## 2020-01-16 DIAGNOSIS — M1711 Unilateral primary osteoarthritis, right knee: Secondary | ICD-10-CM | POA: Diagnosis not present

## 2020-01-16 DIAGNOSIS — M25461 Effusion, right knee: Secondary | ICD-10-CM | POA: Diagnosis not present

## 2020-01-16 DIAGNOSIS — S81801A Unspecified open wound, right lower leg, initial encounter: Secondary | ICD-10-CM | POA: Diagnosis not present

## 2020-01-16 DIAGNOSIS — I10 Essential (primary) hypertension: Secondary | ICD-10-CM | POA: Diagnosis not present

## 2020-01-16 DIAGNOSIS — M797 Fibromyalgia: Secondary | ICD-10-CM | POA: Diagnosis not present

## 2020-01-17 DIAGNOSIS — I82423 Acute embolism and thrombosis of iliac vein, bilateral: Secondary | ICD-10-CM | POA: Insufficient documentation

## 2020-01-18 DIAGNOSIS — I82401 Acute embolism and thrombosis of unspecified deep veins of right lower extremity: Secondary | ICD-10-CM | POA: Insufficient documentation

## 2020-01-18 DIAGNOSIS — E876 Hypokalemia: Secondary | ICD-10-CM | POA: Insufficient documentation

## 2020-01-24 ENCOUNTER — Telehealth (INDEPENDENT_AMBULATORY_CARE_PROVIDER_SITE_OTHER): Payer: Medicare Other | Admitting: Psychiatry

## 2020-01-24 ENCOUNTER — Encounter (HOSPITAL_COMMUNITY): Payer: Self-pay | Admitting: Psychiatry

## 2020-01-24 ENCOUNTER — Other Ambulatory Visit: Payer: Self-pay

## 2020-01-24 DIAGNOSIS — F331 Major depressive disorder, recurrent, moderate: Secondary | ICD-10-CM | POA: Diagnosis not present

## 2020-01-24 MED ORDER — CLONAZEPAM 0.5 MG PO TABS: 0.2500 mg | ORAL_TABLET | Freq: Two times a day (BID) | ORAL | 2 refills | Status: DC | PRN

## 2020-01-24 MED ORDER — FLUOXETINE HCL 20 MG PO CAPS: 20.0000 mg | ORAL_CAPSULE | Freq: Every morning | ORAL | 2 refills | Status: DC

## 2020-01-24 MED ORDER — QUETIAPINE FUMARATE 200 MG PO TABS: 200.0000 mg | ORAL_TABLET | Freq: Every day | ORAL | 2 refills | Status: DC
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
## 2020-01-30 DIAGNOSIS — Z299 Encounter for prophylactic measures, unspecified: Secondary | ICD-10-CM | POA: Diagnosis not present

## 2020-01-30 DIAGNOSIS — I509 Heart failure, unspecified: Secondary | ICD-10-CM | POA: Diagnosis not present

## 2020-01-30 DIAGNOSIS — I10 Essential (primary) hypertension: Secondary | ICD-10-CM | POA: Diagnosis not present

## 2020-01-30 DIAGNOSIS — I82409 Acute embolism and thrombosis of unspecified deep veins of unspecified lower extremity: Secondary | ICD-10-CM | POA: Diagnosis not present

## 2020-02-05 DIAGNOSIS — M5030 Other cervical disc degeneration, unspecified cervical region: Secondary | ICD-10-CM | POA: Diagnosis not present

## 2020-02-05 DIAGNOSIS — G894 Chronic pain syndrome: Secondary | ICD-10-CM | POA: Diagnosis not present

## 2020-02-05 DIAGNOSIS — Z79899 Other long term (current) drug therapy: Secondary | ICD-10-CM | POA: Diagnosis not present

## 2020-02-08 ENCOUNTER — Other Ambulatory Visit: Payer: Self-pay

## 2020-02-08 ENCOUNTER — Encounter: Payer: Self-pay | Admitting: Vascular Surgery

## 2020-02-08 ENCOUNTER — Ambulatory Visit (INDEPENDENT_AMBULATORY_CARE_PROVIDER_SITE_OTHER): Payer: Medicare Other | Admitting: Vascular Surgery

## 2020-02-08 VITALS — BP 119/83 | HR 85 | Temp 97.7°F | Resp 16 | Ht 63.0 in | Wt 163.0 lb

## 2020-02-08 DIAGNOSIS — I83811 Varicose veins of right lower extremities with pain: Secondary | ICD-10-CM
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
## 2020-02-20 DIAGNOSIS — M818 Other osteoporosis without current pathological fracture: Secondary | ICD-10-CM | POA: Diagnosis not present

## 2020-03-06 DIAGNOSIS — M5030 Other cervical disc degeneration, unspecified cervical region: Secondary | ICD-10-CM | POA: Diagnosis not present

## 2020-03-06 DIAGNOSIS — Z79899 Other long term (current) drug therapy: Secondary | ICD-10-CM | POA: Diagnosis not present

## 2020-03-06 DIAGNOSIS — G894 Chronic pain syndrome: Secondary | ICD-10-CM | POA: Diagnosis not present

## 2020-03-12 ENCOUNTER — Telehealth (HOSPITAL_COMMUNITY): Payer: Self-pay | Admitting: Psychiatry

## 2020-03-12 NOTE — Telephone Encounter (Signed)
Called to schedule f/u appt left vm 

## 2020-04-05 DIAGNOSIS — G894 Chronic pain syndrome: Secondary | ICD-10-CM | POA: Diagnosis not present

## 2020-04-05 DIAGNOSIS — M5030 Other cervical disc degeneration, unspecified cervical region: Secondary | ICD-10-CM | POA: Diagnosis not present

## 2020-04-05 DIAGNOSIS — Z79899 Other long term (current) drug therapy: Secondary | ICD-10-CM | POA: Diagnosis not present
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
## 2020-04-10 ENCOUNTER — Encounter: Payer: Self-pay | Admitting: Physician Assistant

## 2020-04-10 ENCOUNTER — Ambulatory Visit (INDEPENDENT_AMBULATORY_CARE_PROVIDER_SITE_OTHER): Payer: Medicare Other | Admitting: Physician Assistant

## 2020-04-10 VITALS — BP 114/77 | HR 74 | Ht 63.0 in | Wt 167.2 lb

## 2020-04-10 DIAGNOSIS — F17210 Nicotine dependence, cigarettes, uncomplicated: Secondary | ICD-10-CM | POA: Diagnosis not present

## 2020-04-10 DIAGNOSIS — I82401 Acute embolism and thrombosis of unspecified deep veins of right lower extremity: Secondary | ICD-10-CM | POA: Diagnosis not present

## 2020-04-10 DIAGNOSIS — L97919 Non-pressure chronic ulcer of unspecified part of right lower leg with unspecified severity: Secondary | ICD-10-CM | POA: Diagnosis not present

## 2020-04-10 DIAGNOSIS — I10 Essential (primary) hypertension: Secondary | ICD-10-CM | POA: Diagnosis not present

## 2020-04-10 DIAGNOSIS — R079 Chest pain, unspecified: Secondary | ICD-10-CM

## 2020-04-10 DIAGNOSIS — I82409 Acute embolism and thrombosis of unspecified deep veins of unspecified lower extremity: Secondary | ICD-10-CM | POA: Diagnosis not present

## 2020-04-10 DIAGNOSIS — E7849 Other hyperlipidemia: Secondary | ICD-10-CM

## 2020-04-10 DIAGNOSIS — Z299 Encounter for prophylactic measures, unspecified: Secondary | ICD-10-CM | POA: Diagnosis not present

## 2020-04-10 NOTE — Patient Instructions (Addendum)
Medication Instructions:  Your physician recommends that you continue on your current medications as directed. Please refer to the Current Medication list given to you today. *If you need a refill on your cardiac medications before your next appointment, please call your pharmacy*   Lab Work: None If you have labs (blood work) drawn today and your tests are completely normal, you will receive your results only by: Marland Kitchen MyChart Message (if you have MyChart) OR . A paper copy in the mail If you have any lab test that is abnormal or we need to change your treatment, we will call you to review the results.   Testing/Procedures: None   Follow-Up: At [facility], you and your health needs are our priority.  As part of our continuing mission to provide you with exceptional heart care, we have created designated Provider Care Teams.  These Care Teams include your primary Cardiologist (physician) and Advanced Practice Providers (APPs -  Physician Assistants and Nurse Practitioners) who all work together to provide you with the care you need, when you need it.   Your next appointment:   October 08, 2020 at 2:40 PM  The format for your next appointment:   In Person  Provider:   Carlyle Dolly, MD

## 2020-04-15 DIAGNOSIS — I82811 Embolism and thrombosis of superficial veins of right lower extremities: Secondary | ICD-10-CM | POA: Diagnosis not present

## 2020-04-15 DIAGNOSIS — I82291 Chronic embolism and thrombosis of other thoracic veins: Secondary | ICD-10-CM | POA: Diagnosis not present

## 2020-04-18 ENCOUNTER — Other Ambulatory Visit: Payer: Self-pay | Admitting: *Deleted

## 2020-04-18 DIAGNOSIS — I10 Essential (primary) hypertension: Secondary | ICD-10-CM | POA: Diagnosis not present

## 2020-04-18 DIAGNOSIS — I509 Heart failure, unspecified: Secondary | ICD-10-CM | POA: Diagnosis not present

## 2020-04-18 DIAGNOSIS — I83813 Varicose veins of bilateral lower extremities with pain: Secondary | ICD-10-CM

## 2020-04-18 DIAGNOSIS — Z299 Encounter for prophylactic measures, unspecified: Secondary | ICD-10-CM | POA: Diagnosis not present

## 2020-04-18 DIAGNOSIS — I82811 Embolism and thrombosis of superficial veins of right lower extremities: Secondary | ICD-10-CM | POA: Diagnosis not present

## 2020-04-22 ENCOUNTER — Other Ambulatory Visit: Payer: Self-pay

## 2020-04-22 ENCOUNTER — Encounter (HOSPITAL_COMMUNITY): Payer: Self-pay

## 2020-04-22 ENCOUNTER — Encounter (HOSPITAL_COMMUNITY): Payer: Self-pay | Admitting: Psychiatry

## 2020-04-22 ENCOUNTER — Telehealth (INDEPENDENT_AMBULATORY_CARE_PROVIDER_SITE_OTHER): Payer: Medicare Other | Admitting: Psychiatry

## 2020-04-22 DIAGNOSIS — F331 Major depressive disorder, recurrent, moderate: Secondary | ICD-10-CM | POA: Diagnosis not present

## 2020-04-22 MED ORDER — QUETIAPINE FUMARATE 200 MG PO TABS: 200.0000 mg | ORAL_TABLET | Freq: Every day | ORAL | 2 refills | Status: DC

## 2020-04-22 MED ORDER — CLONAZEPAM 0.5 MG PO TABS: 0.5000 mg | ORAL_TABLET | Freq: Two times a day (BID) | ORAL | 2 refills | Status: DC | PRN

## 2020-04-22 MED ORDER — FLUOXETINE HCL 20 MG PO CAPS: 20.0000 mg | ORAL_CAPSULE | Freq: Every morning | ORAL | 2 refills | Status: DC
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
## 2020-04-24 ENCOUNTER — Encounter: Payer: Self-pay | Admitting: Vascular Surgery

## 2020-04-24 ENCOUNTER — Ambulatory Visit (HOSPITAL_COMMUNITY)
Admission: RE | Admit: 2020-04-24 | Discharge: 2020-04-24 | Disposition: A | Discharge status: Home / Self Care | Payer: Medicare Other | Source: Ambulatory Visit | Attending: Vascular Surgery | Admitting: Vascular Surgery

## 2020-04-24 ENCOUNTER — Telehealth: Payer: Self-pay | Admitting: *Deleted

## 2020-04-24 ENCOUNTER — Other Ambulatory Visit: Payer: Self-pay

## 2020-04-24 ENCOUNTER — Ambulatory Visit (INDEPENDENT_AMBULATORY_CARE_PROVIDER_SITE_OTHER): Payer: Medicare Other | Admitting: Vascular Surgery

## 2020-04-24 VITALS — BP 139/87 | HR 100 | Temp 98.0°F | Resp 20 | Ht 63.0 in | Wt 167.0 lb

## 2020-04-24 DIAGNOSIS — I83813 Varicose veins of bilateral lower extremities with pain: Secondary | ICD-10-CM
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
## 2020-05-03 DIAGNOSIS — M5030 Other cervical disc degeneration, unspecified cervical region: Secondary | ICD-10-CM | POA: Diagnosis not present

## 2020-05-03 DIAGNOSIS — F17210 Nicotine dependence, cigarettes, uncomplicated: Secondary | ICD-10-CM | POA: Diagnosis not present

## 2020-05-03 DIAGNOSIS — F17200 Nicotine dependence, unspecified, uncomplicated: Secondary | ICD-10-CM | POA: Diagnosis not present

## 2020-05-03 DIAGNOSIS — Z79899 Other long term (current) drug therapy: Secondary | ICD-10-CM | POA: Diagnosis not present

## 2020-05-03 DIAGNOSIS — G894 Chronic pain syndrome: Secondary | ICD-10-CM | POA: Diagnosis not present

## 2020-06-04 DIAGNOSIS — Z79899 Other long term (current) drug therapy: Secondary | ICD-10-CM | POA: Diagnosis not present

## 2020-06-04 DIAGNOSIS — M5030 Other cervical disc degeneration, unspecified cervical region: Secondary | ICD-10-CM | POA: Diagnosis not present

## 2020-06-04 DIAGNOSIS — G894 Chronic pain syndrome: Secondary | ICD-10-CM | POA: Diagnosis not present

## 2020-06-04 DIAGNOSIS — F17210 Nicotine dependence, cigarettes, uncomplicated: Secondary | ICD-10-CM | POA: Diagnosis not present

## 2020-06-04 DIAGNOSIS — F17200 Nicotine dependence, unspecified, uncomplicated: Secondary | ICD-10-CM | POA: Diagnosis not present

## 2020-06-24 DIAGNOSIS — M7918 Myalgia, other site: Secondary | ICD-10-CM | POA: Diagnosis not present

## 2020-06-24 DIAGNOSIS — I10 Essential (primary) hypertension: Secondary | ICD-10-CM | POA: Diagnosis not present

## 2020-06-24 DIAGNOSIS — M4322 Fusion of spine, cervical region: Secondary | ICD-10-CM | POA: Diagnosis not present

## 2020-06-24 DIAGNOSIS — M542 Cervicalgia: Secondary | ICD-10-CM | POA: Diagnosis not present

## 2020-07-04 DIAGNOSIS — F17200 Nicotine dependence, unspecified, uncomplicated: Secondary | ICD-10-CM | POA: Diagnosis not present

## 2020-07-04 DIAGNOSIS — F17210 Nicotine dependence, cigarettes, uncomplicated: Secondary | ICD-10-CM | POA: Diagnosis not present

## 2020-07-04 DIAGNOSIS — G894 Chronic pain syndrome: Secondary | ICD-10-CM | POA: Diagnosis not present

## 2020-07-04 DIAGNOSIS — Z79899 Other long term (current) drug therapy: Secondary | ICD-10-CM | POA: Diagnosis not present

## 2020-07-04 DIAGNOSIS — M5030 Other cervical disc degeneration, unspecified cervical region: Secondary | ICD-10-CM | POA: Diagnosis not present

## 2020-07-16 IMAGING — MR MR CERVICAL SPINE W/O CM
4 of 5 series · 31 of 48 positions shown · non-contrast
Comparison: CT cervical spine dated June 03, 2015. MRI cervical
spine dated September 19, 2013.

CLINICAL DATA: Chronic neck pain radiating into the left arm with
weakness and numbness for the past year.

EXAM:
MRI CERVICAL SPINE WITHOUT CONTRAST
TECHNIQUE: Multiplanar, multisequence MR imaging of the cervical spine was
performed. No intravenous contrast was administered.

[Series 4: T1 · sagittal · 3.3mm · 0.41mm/px · 8 of 12 slices shown]
[im 1/12]
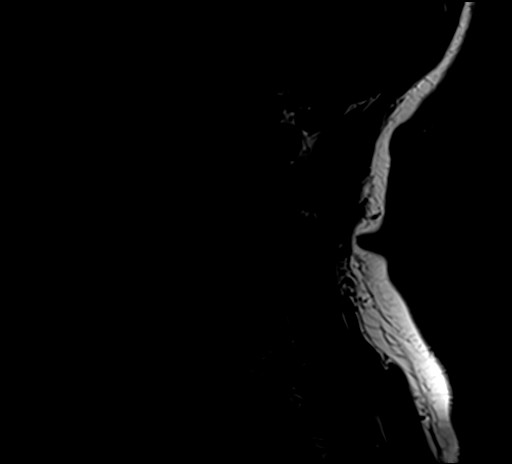
[im 2/12]
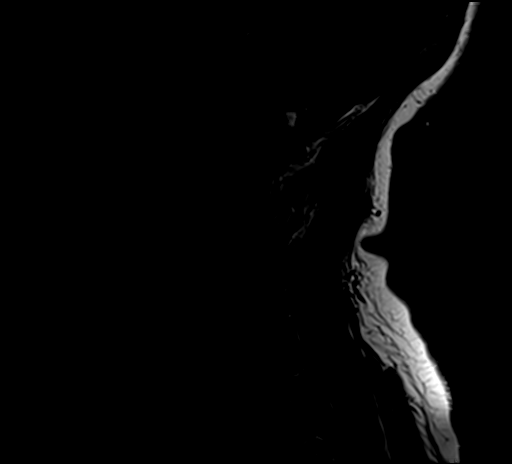
[im 4/12]
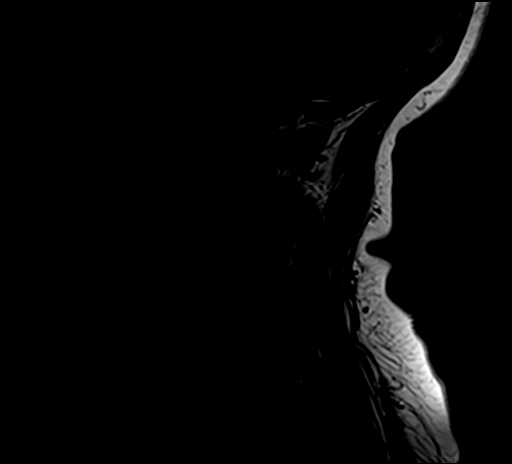
[im 5/12]
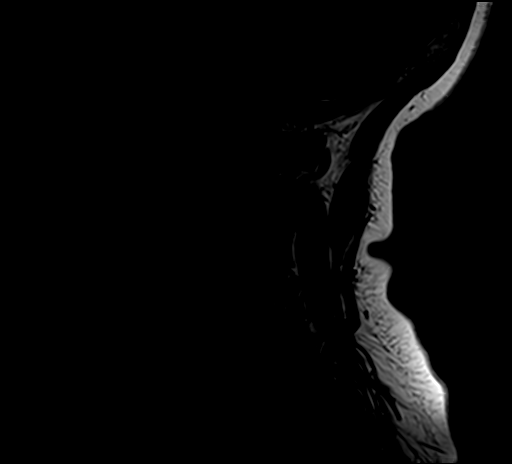
[im 7/12]
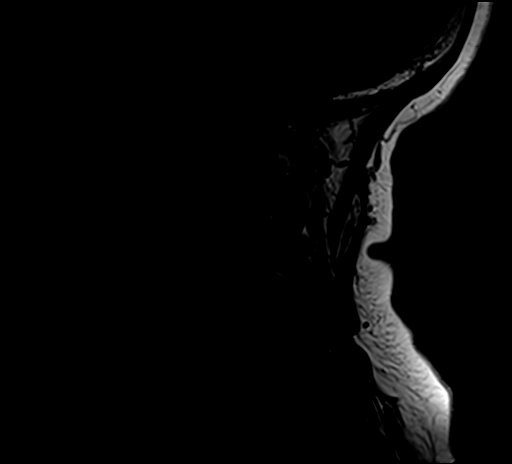
[im 8/12]
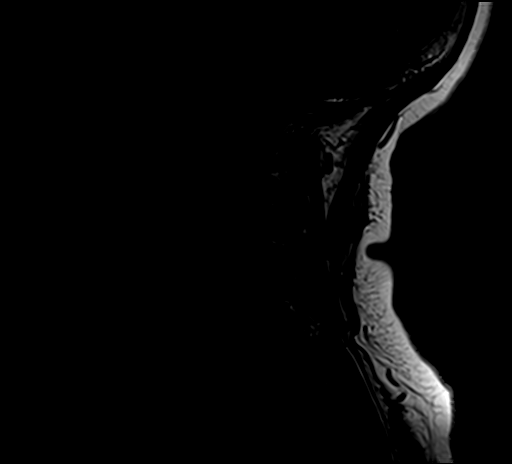
[im 10/12]
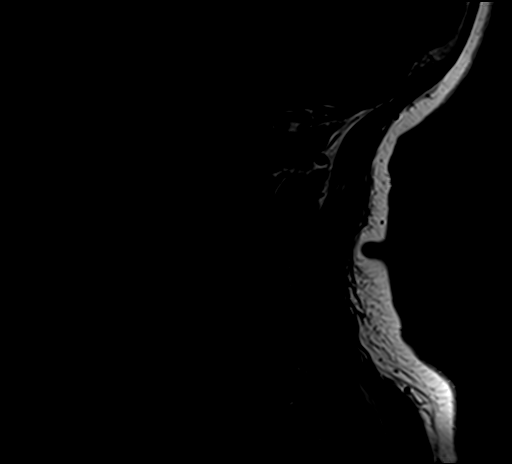
[im 12/12]
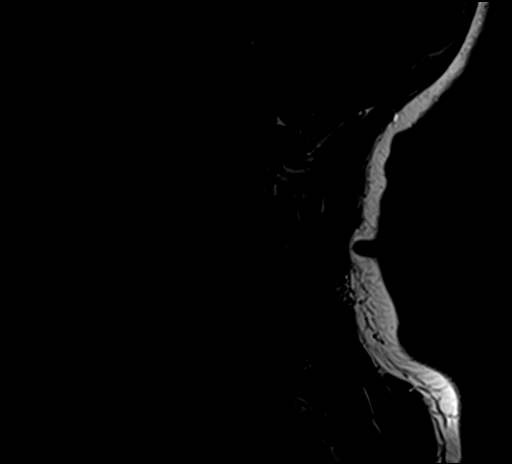

[Series 5: T2 · sagittal · 3.3mm · 0.41mm/px · 7 of 12 slices shown (1 of 2)]
[im 1/12]
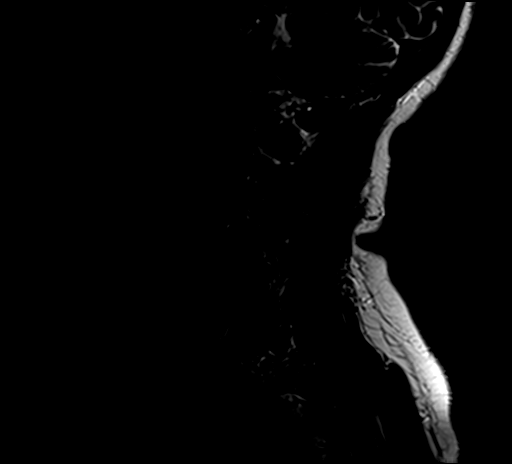
[im 2/12]
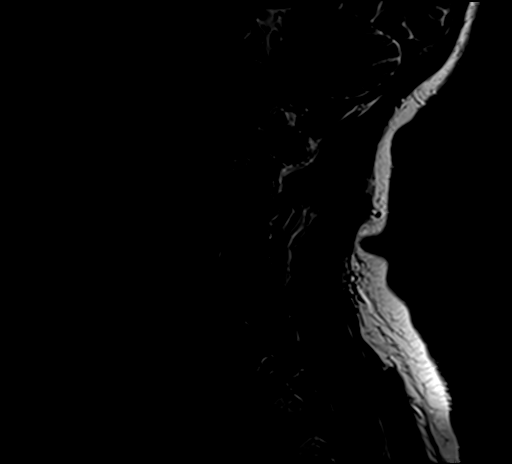
[im 4/12]
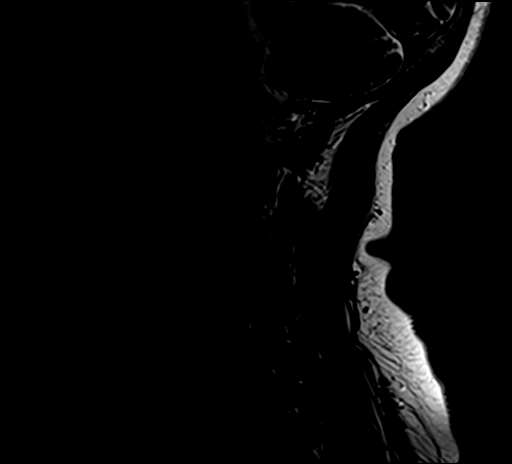
[im 6/12]
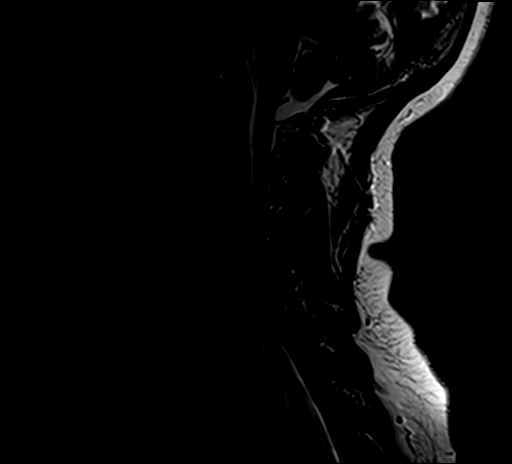
[im 8/12]
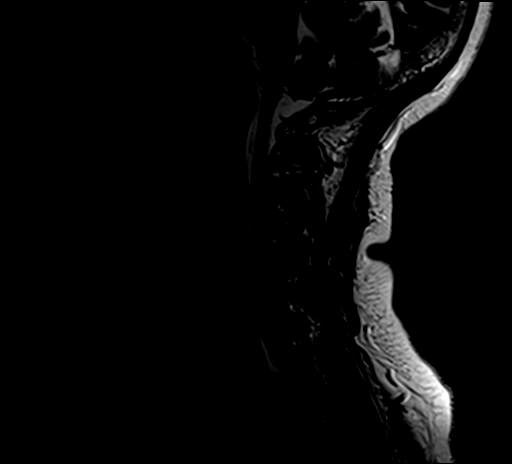
[im 10/12]
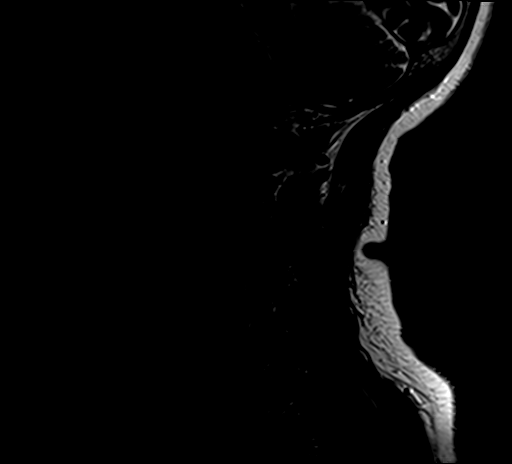
[im 12/12]
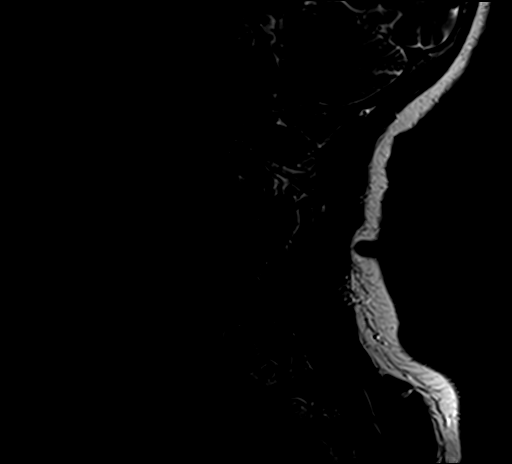

[Series 6: STIR · sagittal · 3.3mm · 0.82mm/px · 7 of 12 slices shown]
[im 1/12]
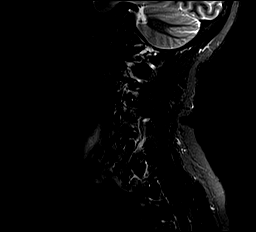
[im 2/12]
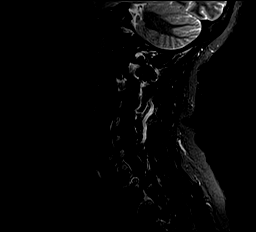
[im 4/12]
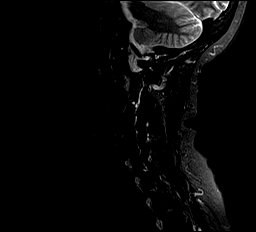
[im 6/12]
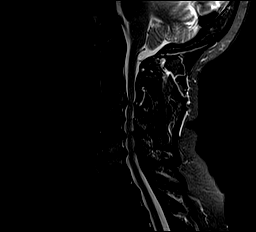
[im 8/12]
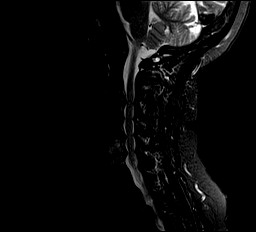
[im 10/12]
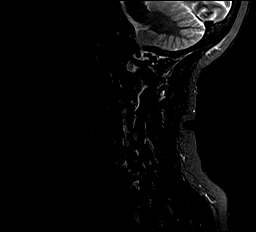
[im 12/12]
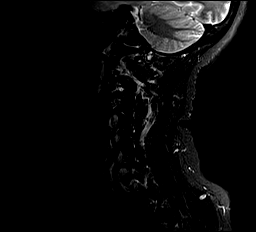

[Series 8: T2 · axial · 3.0mm · 0.70mm/px · z∈[-82,-13]mm · 9 of 22 slices shown (2 of 2)]
[im 1/22]
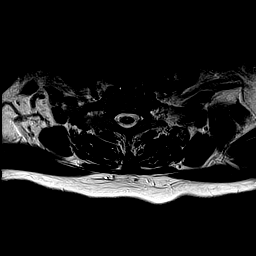
[im 4/22]
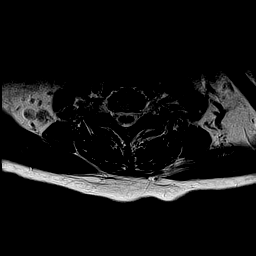
[im 8/22]
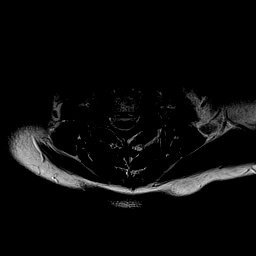
[im 9/22]
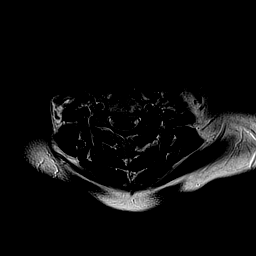
[im 11/22]
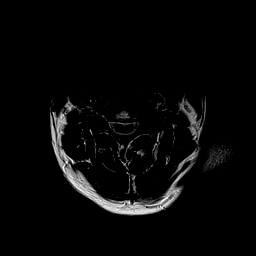
[im 13/22]
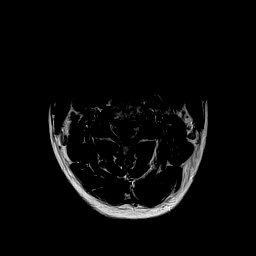
[im 15/22]
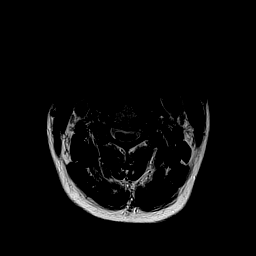
[im 18/22]
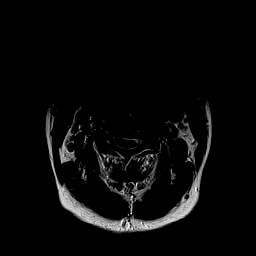
[im 22/22]
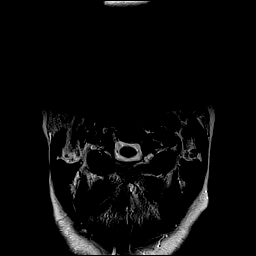

[31 of 48 positions shown; findings below may reference images not displayed]

FINDINGS: Alignment: Physiologic.

Vertebrae: Heterogeneous marrow signal with chronic degenerative
endplate changes. No focal lesion. No acute fracture or evidence of
discitis.

Cord: Flattening of the cord at C3-C4.  Normal cord signal.

Posterior Fossa, vertebral arteries, paraspinal tissues: Negative.

Disc levels:

C2-C3:  Negative.

C3-C4: Progressive disc bulging eccentric to the left and left
greater than right uncovertebral hypertrophy. Flattening of the
ventral cord with progressive moderate to severe central spinal
canal stenosis. Unchanged severe left and moderate right
neuroforaminal stenosis.

C4-C5: Unchanged left paracentral disc protrusion and mild bilateral
uncovertebral hypertrophy resulting in mild central spinal canal
stenosis and severe left and moderate right neuroforaminal stenosis.

C5-C6: Unchanged disc bulge abutting the ventral cord and moderate
bilateral uncovertebral hypertrophy. Unchanged mild central spinal
canal stenosis and severe left and moderate right neuroforaminal
stenosis.

C6-C7: Unchanged disc bulge abutting the ventral cord and mild
bilateral uncovertebral hypertrophy. Unchanged mild central spinal
canal stenosis and moderate bilateral neuroforaminal stenosis.

C7-T1:  Negative.

Unchanged right paracentral disc protrusion at T2-T3 slightly
deforming the right ventral cord.
IMPRESSION: 1. Progressive degenerative changes at C3-C4 with increased
flattening of the ventral cord and worsening moderate to severe
central spinal canal stenosis. Normal cord signal.
2. Unchanged mild central spinal canal stenosis from C4-C5 through
C6-C7.
3. Unchanged severe left and moderate right neuroforaminal stenosis
from C3-C4 through C5-C6.

## 2020-07-18 DIAGNOSIS — Z1231 Encounter for screening mammogram for malignant neoplasm of breast: Secondary | ICD-10-CM | POA: Diagnosis not present

## 2020-07-30 DIAGNOSIS — I10 Essential (primary) hypertension: Secondary | ICD-10-CM | POA: Diagnosis not present

## 2020-07-30 DIAGNOSIS — I739 Peripheral vascular disease, unspecified: Secondary | ICD-10-CM | POA: Diagnosis not present

## 2020-07-30 DIAGNOSIS — R42 Dizziness and giddiness: Secondary | ICD-10-CM | POA: Diagnosis not present

## 2020-07-30 DIAGNOSIS — Z299 Encounter for prophylactic measures, unspecified: Secondary | ICD-10-CM | POA: Diagnosis not present

## 2020-07-30 DIAGNOSIS — L239 Allergic contact dermatitis, unspecified cause: Secondary | ICD-10-CM | POA: Diagnosis not present

## 2020-08-05 DIAGNOSIS — F17200 Nicotine dependence, unspecified, uncomplicated: Secondary | ICD-10-CM | POA: Diagnosis not present

## 2020-08-05 DIAGNOSIS — R030 Elevated blood-pressure reading, without diagnosis of hypertension: Secondary | ICD-10-CM | POA: Diagnosis not present

## 2020-08-05 DIAGNOSIS — F17210 Nicotine dependence, cigarettes, uncomplicated: Secondary | ICD-10-CM | POA: Diagnosis not present

## 2020-08-05 DIAGNOSIS — M5030 Other cervical disc degeneration, unspecified cervical region: Secondary | ICD-10-CM | POA: Diagnosis not present

## 2020-08-05 DIAGNOSIS — G894 Chronic pain syndrome: Secondary | ICD-10-CM | POA: Diagnosis not present

## 2020-08-05 DIAGNOSIS — Z79899 Other long term (current) drug therapy: Secondary | ICD-10-CM | POA: Diagnosis not present

## 2020-08-06 ENCOUNTER — Telehealth (INDEPENDENT_AMBULATORY_CARE_PROVIDER_SITE_OTHER): Payer: Medicare Other | Admitting: Psychiatry

## 2020-08-06 ENCOUNTER — Other Ambulatory Visit: Payer: Self-pay

## 2020-08-06 ENCOUNTER — Encounter (HOSPITAL_COMMUNITY): Payer: Self-pay | Admitting: Psychiatry

## 2020-08-06 DIAGNOSIS — F331 Major depressive disorder, recurrent, moderate: Secondary | ICD-10-CM

## 2020-08-06 MED ORDER — QUETIAPINE FUMARATE 200 MG PO TABS: 200.0000 mg | ORAL_TABLET | Freq: Every day | ORAL | 2 refills | Status: DC

## 2020-08-06 MED ORDER — CLONAZEPAM 0.5 MG PO TABS: 0.5000 mg | ORAL_TABLET | Freq: Two times a day (BID) | ORAL | 2 refills | Status: DC | PRN

## 2020-08-06 MED ORDER — FLUOXETINE HCL 20 MG PO CAPS: 20.0000 mg | ORAL_CAPSULE | Freq: Every morning | ORAL | 2 refills | Status: DC
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
## 2020-08-10 DIAGNOSIS — Z79899 Other long term (current) drug therapy: Secondary | ICD-10-CM | POA: Diagnosis not present

## 2020-09-04 DIAGNOSIS — R030 Elevated blood-pressure reading, without diagnosis of hypertension: Secondary | ICD-10-CM | POA: Diagnosis not present

## 2020-09-04 DIAGNOSIS — G894 Chronic pain syndrome: Secondary | ICD-10-CM | POA: Diagnosis not present

## 2020-09-04 DIAGNOSIS — M5030 Other cervical disc degeneration, unspecified cervical region: Secondary | ICD-10-CM | POA: Diagnosis not present

## 2020-09-04 DIAGNOSIS — Z79899 Other long term (current) drug therapy: Secondary | ICD-10-CM | POA: Diagnosis not present

## 2020-10-03 DIAGNOSIS — G894 Chronic pain syndrome: Secondary | ICD-10-CM | POA: Diagnosis not present

## 2020-10-03 DIAGNOSIS — R030 Elevated blood-pressure reading, without diagnosis of hypertension: Secondary | ICD-10-CM | POA: Diagnosis not present

## 2020-10-03 DIAGNOSIS — F17210 Nicotine dependence, cigarettes, uncomplicated: Secondary | ICD-10-CM | POA: Diagnosis not present

## 2020-10-03 DIAGNOSIS — M5030 Other cervical disc degeneration, unspecified cervical region: Secondary | ICD-10-CM | POA: Diagnosis not present

## 2020-10-03 DIAGNOSIS — Z79899 Other long term (current) drug therapy: Secondary | ICD-10-CM | POA: Diagnosis not present

## 2020-10-03 DIAGNOSIS — F17200 Nicotine dependence, unspecified, uncomplicated: Secondary | ICD-10-CM | POA: Diagnosis not present

## 2020-10-07 DIAGNOSIS — Z79899 Other long term (current) drug therapy: Secondary | ICD-10-CM | POA: Diagnosis not present

## 2020-10-08 ENCOUNTER — Encounter: Payer: Self-pay | Admitting: *Deleted

## 2020-10-08 ENCOUNTER — Encounter: Payer: Self-pay | Admitting: Cardiology

## 2020-10-08 ENCOUNTER — Ambulatory Visit (INDEPENDENT_AMBULATORY_CARE_PROVIDER_SITE_OTHER): Payer: Medicare Other | Admitting: Cardiology

## 2020-10-08 VITALS — BP 124/80 | HR 72 | Ht 63.0 in | Wt 154.2 lb

## 2020-10-08 DIAGNOSIS — I10 Essential (primary) hypertension: Secondary | ICD-10-CM | POA: Diagnosis not present

## 2020-10-08 DIAGNOSIS — R0789 Other chest pain: Secondary | ICD-10-CM | POA: Diagnosis not present

## 2020-10-08 DIAGNOSIS — E782 Mixed hyperlipidemia: Secondary | ICD-10-CM

## 2020-10-08 NOTE — Patient Instructions (Signed)
Medication Instructions:  Continue all current medications.   Labwork: none  Testing/Procedures: none  Follow-Up: 6 months   Any Other Special Instructions Will Be Listed Below (If Applicable).   If you need a refill on your cardiac medications before your next appointment, please call your pharmacy.  
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
## 2020-11-04 DIAGNOSIS — Z79899 Other long term (current) drug therapy: Secondary | ICD-10-CM | POA: Diagnosis not present

## 2020-11-04 DIAGNOSIS — Z9189 Other specified personal risk factors, not elsewhere classified: Secondary | ICD-10-CM | POA: Diagnosis not present

## 2020-11-04 DIAGNOSIS — M5030 Other cervical disc degeneration, unspecified cervical region: Secondary | ICD-10-CM | POA: Diagnosis not present

## 2020-11-04 DIAGNOSIS — G894 Chronic pain syndrome: Secondary | ICD-10-CM | POA: Diagnosis not present

## 2020-11-18 DIAGNOSIS — R58 Hemorrhage, not elsewhere classified: Secondary | ICD-10-CM | POA: Diagnosis not present

## 2020-12-02 DIAGNOSIS — R030 Elevated blood-pressure reading, without diagnosis of hypertension: Secondary | ICD-10-CM | POA: Diagnosis not present

## 2020-12-02 DIAGNOSIS — Z79899 Other long term (current) drug therapy: Secondary | ICD-10-CM | POA: Diagnosis not present

## 2020-12-02 DIAGNOSIS — G894 Chronic pain syndrome: Secondary | ICD-10-CM | POA: Diagnosis not present

## 2020-12-02 DIAGNOSIS — F17210 Nicotine dependence, cigarettes, uncomplicated: Secondary | ICD-10-CM | POA: Diagnosis not present

## 2020-12-02 DIAGNOSIS — M5030 Other cervical disc degeneration, unspecified cervical region: Secondary | ICD-10-CM | POA: Diagnosis not present

## 2020-12-19 ENCOUNTER — Other Ambulatory Visit: Payer: Self-pay

## 2020-12-19 ENCOUNTER — Encounter (HOSPITAL_COMMUNITY): Payer: Self-pay | Admitting: Psychiatry

## 2020-12-19 ENCOUNTER — Telehealth (INDEPENDENT_AMBULATORY_CARE_PROVIDER_SITE_OTHER): Payer: Medicare Other | Admitting: Psychiatry

## 2020-12-19 DIAGNOSIS — F331 Major depressive disorder, recurrent, moderate: Secondary | ICD-10-CM | POA: Diagnosis not present

## 2020-12-19 MED ORDER — FLUOXETINE HCL 40 MG PO CAPS: 40.0000 mg | ORAL_CAPSULE | Freq: Every day | ORAL | 2 refills | Status: DC

## 2020-12-19 MED ORDER — QUETIAPINE FUMARATE 200 MG PO TABS: 200.0000 mg | ORAL_TABLET | Freq: Every day | ORAL | 2 refills | Status: DC

## 2020-12-19 MED ORDER — CLONAZEPAM 0.5 MG PO TABS: 0.5000 mg | ORAL_TABLET | Freq: Two times a day (BID) | ORAL | 2 refills | Status: DC | PRN
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
## 2021-01-02 DIAGNOSIS — Z79899 Other long term (current) drug therapy: Secondary | ICD-10-CM | POA: Diagnosis not present

## 2021-01-02 DIAGNOSIS — M5030 Other cervical disc degeneration, unspecified cervical region: Secondary | ICD-10-CM | POA: Diagnosis not present

## 2021-01-02 DIAGNOSIS — Z0000 Encounter for general adult medical examination without abnormal findings: Secondary | ICD-10-CM | POA: Diagnosis not present

## 2021-01-02 DIAGNOSIS — R030 Elevated blood-pressure reading, without diagnosis of hypertension: Secondary | ICD-10-CM | POA: Diagnosis not present

## 2021-01-02 DIAGNOSIS — G894 Chronic pain syndrome: Secondary | ICD-10-CM | POA: Diagnosis not present

## 2021-01-05 DIAGNOSIS — Z79899 Other long term (current) drug therapy: Secondary | ICD-10-CM | POA: Diagnosis not present

## 2021-01-31 DIAGNOSIS — G894 Chronic pain syndrome: Secondary | ICD-10-CM | POA: Diagnosis not present

## 2021-01-31 DIAGNOSIS — Z79899 Other long term (current) drug therapy: Secondary | ICD-10-CM | POA: Diagnosis not present

## 2021-01-31 DIAGNOSIS — R030 Elevated blood-pressure reading, without diagnosis of hypertension: Secondary | ICD-10-CM | POA: Diagnosis not present

## 2021-01-31 DIAGNOSIS — M5030 Other cervical disc degeneration, unspecified cervical region: Secondary | ICD-10-CM | POA: Diagnosis not present

## 2021-02-05 ENCOUNTER — Other Ambulatory Visit (HOSPITAL_COMMUNITY): Payer: Self-pay | Admitting: Psychiatry

## 2021-02-11 ENCOUNTER — Other Ambulatory Visit (HOSPITAL_COMMUNITY): Payer: Self-pay | Admitting: Psychiatry

## 2021-02-21 DIAGNOSIS — R569 Unspecified convulsions: Secondary | ICD-10-CM | POA: Diagnosis not present

## 2021-02-21 DIAGNOSIS — I739 Peripheral vascular disease, unspecified: Secondary | ICD-10-CM | POA: Diagnosis not present

## 2021-03-03 DIAGNOSIS — G894 Chronic pain syndrome: Secondary | ICD-10-CM | POA: Diagnosis not present

## 2021-03-03 DIAGNOSIS — R69 Illness, unspecified: Secondary | ICD-10-CM | POA: Diagnosis not present

## 2021-03-03 DIAGNOSIS — R030 Elevated blood-pressure reading, without diagnosis of hypertension: Secondary | ICD-10-CM | POA: Diagnosis not present

## 2021-03-03 DIAGNOSIS — Z79899 Other long term (current) drug therapy: Secondary | ICD-10-CM | POA: Diagnosis not present

## 2021-03-03 DIAGNOSIS — M5030 Other cervical disc degeneration, unspecified cervical region: Secondary | ICD-10-CM | POA: Diagnosis not present

## 2021-03-06 DIAGNOSIS — Z79899 Other long term (current) drug therapy: Secondary | ICD-10-CM | POA: Diagnosis not present

## 2021-03-25 DIAGNOSIS — E782 Mixed hyperlipidemia: Secondary | ICD-10-CM | POA: Diagnosis not present

## 2021-03-25 DIAGNOSIS — I10 Essential (primary) hypertension: Secondary | ICD-10-CM | POA: Diagnosis not present

## 2021-04-01 ENCOUNTER — Other Ambulatory Visit (HOSPITAL_COMMUNITY): Payer: Self-pay | Admitting: Psychiatry

## 2021-04-02 NOTE — Telephone Encounter (Signed)
Call for appt

## 2021-04-03 DIAGNOSIS — Z0000 Encounter for general adult medical examination without abnormal findings: Secondary | ICD-10-CM | POA: Diagnosis not present

## 2021-04-03 DIAGNOSIS — M5030 Other cervical disc degeneration, unspecified cervical region: Secondary | ICD-10-CM | POA: Diagnosis not present

## 2021-04-03 DIAGNOSIS — Z79899 Other long term (current) drug therapy: Secondary | ICD-10-CM | POA: Diagnosis not present

## 2021-04-03 DIAGNOSIS — G894 Chronic pain syndrome: Secondary | ICD-10-CM | POA: Diagnosis not present

## 2021-04-03 DIAGNOSIS — R030 Elevated blood-pressure reading, without diagnosis of hypertension: Secondary | ICD-10-CM | POA: Diagnosis not present

## 2021-04-08 ENCOUNTER — Telehealth (HOSPITAL_COMMUNITY): Payer: Self-pay | Admitting: *Deleted

## 2021-04-08 DIAGNOSIS — Z79899 Other long term (current) drug therapy: Secondary | ICD-10-CM | POA: Diagnosis not present

## 2021-04-08 NOTE — Telephone Encounter (Signed)
LMOM

## 2021-04-08 NOTE — Telephone Encounter (Signed)
LMOM to call office to sch f/u appt

## 2021-04-11 ENCOUNTER — Telehealth (INDEPENDENT_AMBULATORY_CARE_PROVIDER_SITE_OTHER): Payer: Medicare Other | Admitting: Psychiatry

## 2021-04-11 ENCOUNTER — Other Ambulatory Visit: Payer: Self-pay

## 2021-04-11 ENCOUNTER — Encounter (HOSPITAL_COMMUNITY): Payer: Self-pay | Admitting: Psychiatry

## 2021-04-11 DIAGNOSIS — F331 Major depressive disorder, recurrent, moderate: Secondary | ICD-10-CM

## 2021-04-11 MED ORDER — FLUOXETINE HCL 20 MG PO CAPS: 20.0000 mg | ORAL_CAPSULE | Freq: Every morning | ORAL | 2 refills | Status: DC

## 2021-04-11 MED ORDER — QUETIAPINE FUMARATE 200 MG PO TABS: 200.0000 mg | ORAL_TABLET | Freq: Every day | ORAL | 2 refills | Status: DC

## 2021-04-11 MED ORDER — FLUOXETINE HCL 40 MG PO CAPS: ORAL_CAPSULE | ORAL | 2 refills | Status: DC

## 2021-04-11 MED ORDER — CLONAZEPAM 0.5 MG PO TABS: ORAL_TABLET | ORAL | 2 refills | Status: DC
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
## 2021-04-17 ENCOUNTER — Ambulatory Visit: Payer: Medicare Other | Admitting: Cardiology
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
## 2021-05-05 DIAGNOSIS — M5030 Other cervical disc degeneration, unspecified cervical region: Secondary | ICD-10-CM | POA: Diagnosis not present

## 2021-05-05 DIAGNOSIS — R030 Elevated blood-pressure reading, without diagnosis of hypertension: Secondary | ICD-10-CM | POA: Diagnosis not present

## 2021-05-05 DIAGNOSIS — Z79899 Other long term (current) drug therapy: Secondary | ICD-10-CM | POA: Diagnosis not present

## 2021-05-05 DIAGNOSIS — G894 Chronic pain syndrome: Secondary | ICD-10-CM | POA: Diagnosis not present

## 2021-05-05 DIAGNOSIS — Z87891 Personal history of nicotine dependence: Secondary | ICD-10-CM | POA: Diagnosis not present

## 2021-05-05 DIAGNOSIS — E559 Vitamin D deficiency, unspecified: Secondary | ICD-10-CM | POA: Diagnosis not present

## 2021-05-12 DIAGNOSIS — Z79899 Other long term (current) drug therapy: Secondary | ICD-10-CM | POA: Diagnosis not present

## 2021-05-22 DIAGNOSIS — I10 Essential (primary) hypertension: Secondary | ICD-10-CM | POA: Diagnosis not present

## 2021-05-22 DIAGNOSIS — J9811 Atelectasis: Secondary | ICD-10-CM | POA: Diagnosis not present

## 2021-05-22 DIAGNOSIS — Z79899 Other long term (current) drug therapy: Secondary | ICD-10-CM | POA: Diagnosis not present

## 2021-05-22 DIAGNOSIS — K2100 Gastro-esophageal reflux disease with esophagitis, without bleeding: Secondary | ICD-10-CM | POA: Diagnosis not present

## 2021-05-22 DIAGNOSIS — R10816 Epigastric abdominal tenderness: Secondary | ICD-10-CM | POA: Diagnosis not present

## 2021-05-22 DIAGNOSIS — R9431 Abnormal electrocardiogram [ECG] [EKG]: Secondary | ICD-10-CM | POA: Diagnosis not present

## 2021-05-22 DIAGNOSIS — R0789 Other chest pain: Secondary | ICD-10-CM | POA: Diagnosis not present

## 2021-05-22 DIAGNOSIS — S22089A Unspecified fracture of T11-T12 vertebra, initial encounter for closed fracture: Secondary | ICD-10-CM | POA: Diagnosis not present

## 2021-05-22 DIAGNOSIS — R109 Unspecified abdominal pain: Secondary | ICD-10-CM | POA: Diagnosis not present

## 2021-05-22 DIAGNOSIS — R000 Tachycardia, unspecified: Secondary | ICD-10-CM | POA: Diagnosis not present

## 2021-05-22 DIAGNOSIS — I722 Aneurysm of renal artery: Secondary | ICD-10-CM | POA: Diagnosis not present

## 2021-05-22 DIAGNOSIS — I700 Atherosclerosis of aorta: Secondary | ICD-10-CM | POA: Diagnosis not present

## 2021-05-22 DIAGNOSIS — F17210 Nicotine dependence, cigarettes, uncomplicated: Secondary | ICD-10-CM | POA: Diagnosis not present

## 2021-05-22 DIAGNOSIS — G629 Polyneuropathy, unspecified: Secondary | ICD-10-CM | POA: Diagnosis not present

## 2021-05-22 DIAGNOSIS — J984 Other disorders of lung: Secondary | ICD-10-CM | POA: Diagnosis not present

## 2021-05-22 DIAGNOSIS — R1013 Epigastric pain: Secondary | ICD-10-CM | POA: Diagnosis not present

## 2021-05-22 DIAGNOSIS — M4854XA Collapsed vertebra, not elsewhere classified, thoracic region, initial encounter for fracture: Secondary | ICD-10-CM | POA: Diagnosis not present

## 2021-05-22 DIAGNOSIS — R079 Chest pain, unspecified: Secondary | ICD-10-CM | POA: Diagnosis not present

## 2021-05-22 DIAGNOSIS — W19XXXA Unspecified fall, initial encounter: Secondary | ICD-10-CM | POA: Diagnosis not present

## 2021-05-27 DIAGNOSIS — M549 Dorsalgia, unspecified: Secondary | ICD-10-CM | POA: Diagnosis not present

## 2021-05-27 DIAGNOSIS — S22000A Wedge compression fracture of unspecified thoracic vertebra, initial encounter for closed fracture: Secondary | ICD-10-CM | POA: Diagnosis not present

## 2021-05-27 DIAGNOSIS — Z789 Other specified health status: Secondary | ICD-10-CM | POA: Diagnosis not present

## 2021-05-27 DIAGNOSIS — J069 Acute upper respiratory infection, unspecified: Secondary | ICD-10-CM | POA: Diagnosis not present

## 2021-05-27 DIAGNOSIS — K219 Gastro-esophageal reflux disease without esophagitis: Secondary | ICD-10-CM | POA: Diagnosis not present

## 2021-05-27 DIAGNOSIS — Z299 Encounter for prophylactic measures, unspecified: Secondary | ICD-10-CM | POA: Diagnosis not present

## 2021-06-05 DIAGNOSIS — Z79899 Other long term (current) drug therapy: Secondary | ICD-10-CM | POA: Diagnosis not present

## 2021-06-05 DIAGNOSIS — Z87891 Personal history of nicotine dependence: Secondary | ICD-10-CM | POA: Diagnosis not present

## 2021-06-05 DIAGNOSIS — G894 Chronic pain syndrome: Secondary | ICD-10-CM | POA: Diagnosis not present

## 2021-06-05 DIAGNOSIS — R030 Elevated blood-pressure reading, without diagnosis of hypertension: Secondary | ICD-10-CM | POA: Diagnosis not present

## 2021-06-05 DIAGNOSIS — M5030 Other cervical disc degeneration, unspecified cervical region: Secondary | ICD-10-CM | POA: Diagnosis not present

## 2021-06-09 DIAGNOSIS — Z79899 Other long term (current) drug therapy: Secondary | ICD-10-CM | POA: Diagnosis not present

## 2021-07-04 DIAGNOSIS — R030 Elevated blood-pressure reading, without diagnosis of hypertension: Secondary | ICD-10-CM | POA: Diagnosis not present

## 2021-07-04 DIAGNOSIS — Z79899 Other long term (current) drug therapy: Secondary | ICD-10-CM | POA: Diagnosis not present

## 2021-07-04 DIAGNOSIS — G894 Chronic pain syndrome: Secondary | ICD-10-CM | POA: Diagnosis not present

## 2021-07-04 DIAGNOSIS — M5030 Other cervical disc degeneration, unspecified cervical region: Secondary | ICD-10-CM | POA: Diagnosis not present

## 2021-07-04 DIAGNOSIS — M5136 Other intervertebral disc degeneration, lumbar region: Secondary | ICD-10-CM | POA: Diagnosis not present

## 2021-07-09 DIAGNOSIS — Z79899 Other long term (current) drug therapy: Secondary | ICD-10-CM | POA: Diagnosis not present

## 2021-07-31 ENCOUNTER — Other Ambulatory Visit (HOSPITAL_COMMUNITY): Payer: Self-pay | Admitting: Psychiatry

## 2021-07-31 NOTE — Telephone Encounter (Signed)
Call for appt

## 2021-08-04 DIAGNOSIS — G894 Chronic pain syndrome: Secondary | ICD-10-CM | POA: Diagnosis not present

## 2021-08-04 DIAGNOSIS — Z79899 Other long term (current) drug therapy: Secondary | ICD-10-CM | POA: Diagnosis not present

## 2021-08-04 DIAGNOSIS — R030 Elevated blood-pressure reading, without diagnosis of hypertension: Secondary | ICD-10-CM | POA: Diagnosis not present

## 2021-08-04 DIAGNOSIS — M5136 Other intervertebral disc degeneration, lumbar region: Secondary | ICD-10-CM | POA: Diagnosis not present

## 2021-08-04 DIAGNOSIS — M5030 Other cervical disc degeneration, unspecified cervical region: Secondary | ICD-10-CM | POA: Diagnosis not present

## 2021-08-08 DIAGNOSIS — Z79899 Other long term (current) drug therapy: Secondary | ICD-10-CM | POA: Diagnosis not present

## 2021-08-28 ENCOUNTER — Other Ambulatory Visit (HOSPITAL_COMMUNITY): Payer: Self-pay | Admitting: Psychiatry

## 2021-08-28 NOTE — Telephone Encounter (Signed)
Call for appt

## 2021-09-09 DIAGNOSIS — Z79899 Other long term (current) drug therapy: Secondary | ICD-10-CM | POA: Diagnosis not present

## 2021-09-09 DIAGNOSIS — M5136 Other intervertebral disc degeneration, lumbar region: Secondary | ICD-10-CM | POA: Diagnosis not present

## 2021-09-09 DIAGNOSIS — M5030 Other cervical disc degeneration, unspecified cervical region: Secondary | ICD-10-CM | POA: Diagnosis not present

## 2021-09-09 DIAGNOSIS — R030 Elevated blood-pressure reading, without diagnosis of hypertension: Secondary | ICD-10-CM | POA: Diagnosis not present

## 2021-09-09 DIAGNOSIS — G894 Chronic pain syndrome: Secondary | ICD-10-CM | POA: Diagnosis not present

## 2021-09-10 NOTE — Telephone Encounter (Signed)
Not able to reach patient and voicemail box is not currently accepting messages.

## 2021-09-11 ENCOUNTER — Telehealth (HOSPITAL_COMMUNITY): Payer: Self-pay | Admitting: *Deleted

## 2021-09-11 DIAGNOSIS — Z79899 Other long term (current) drug therapy: Secondary | ICD-10-CM | POA: Diagnosis not present

## 2021-09-11 NOTE — Telephone Encounter (Signed)
Not able to reach patient. Per pre recorded message this mailbox is not currently accepting messages.

## 2021-09-11 NOTE — Telephone Encounter (Signed)
Called number on file to sch f/u appt and was not able to reach patient. Pre recorded message stated that mailbox is not currently accepting messages.

## 2021-09-29 ENCOUNTER — Other Ambulatory Visit (HOSPITAL_COMMUNITY): Payer: Self-pay | Admitting: Psychiatry

## 2021-10-07 DIAGNOSIS — F17210 Nicotine dependence, cigarettes, uncomplicated: Secondary | ICD-10-CM | POA: Diagnosis not present

## 2021-10-07 DIAGNOSIS — G894 Chronic pain syndrome: Secondary | ICD-10-CM | POA: Diagnosis not present

## 2021-10-07 DIAGNOSIS — M549 Dorsalgia, unspecified: Secondary | ICD-10-CM | POA: Diagnosis not present

## 2021-10-07 DIAGNOSIS — D473 Essential (hemorrhagic) thrombocythemia: Secondary | ICD-10-CM | POA: Diagnosis not present

## 2021-10-07 DIAGNOSIS — I10 Essential (primary) hypertension: Secondary | ICD-10-CM | POA: Diagnosis not present

## 2021-10-07 DIAGNOSIS — Z299 Encounter for prophylactic measures, unspecified: Secondary | ICD-10-CM | POA: Diagnosis not present

## 2021-10-08 ENCOUNTER — Encounter (HOSPITAL_COMMUNITY): Payer: Self-pay | Admitting: Psychiatry

## 2021-10-08 ENCOUNTER — Telehealth (INDEPENDENT_AMBULATORY_CARE_PROVIDER_SITE_OTHER): Payer: Medicare Other | Admitting: Psychiatry

## 2021-10-08 DIAGNOSIS — F331 Major depressive disorder, recurrent, moderate: Secondary | ICD-10-CM | POA: Diagnosis not present

## 2021-10-08 MED ORDER — QUETIAPINE FUMARATE 200 MG PO TABS: 200.0000 mg | ORAL_TABLET | Freq: Every day | ORAL | 2 refills | Status: DC

## 2021-10-08 MED ORDER — FLUOXETINE HCL 40 MG PO CAPS
ORAL_CAPSULE | ORAL | 2 refills | Status: DC
Start: 2021-10-08 — End: 2021-11-13

## 2021-10-08 MED ORDER — CLONAZEPAM 0.5 MG PO TABS: ORAL_TABLET | ORAL | 0 refills | Status: DC
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
## 2021-10-09 DIAGNOSIS — M5136 Other intervertebral disc degeneration, lumbar region: Secondary | ICD-10-CM | POA: Diagnosis not present

## 2021-10-09 DIAGNOSIS — G894 Chronic pain syndrome: Secondary | ICD-10-CM | POA: Diagnosis not present

## 2021-10-09 DIAGNOSIS — Z79899 Other long term (current) drug therapy: Secondary | ICD-10-CM | POA: Diagnosis not present

## 2021-10-09 DIAGNOSIS — R030 Elevated blood-pressure reading, without diagnosis of hypertension: Secondary | ICD-10-CM | POA: Diagnosis not present

## 2021-10-09 DIAGNOSIS — M5030 Other cervical disc degeneration, unspecified cervical region: Secondary | ICD-10-CM | POA: Diagnosis not present

## 2021-10-14 DIAGNOSIS — Z79899 Other long term (current) drug therapy: Secondary | ICD-10-CM | POA: Diagnosis not present

## 2021-11-12 DIAGNOSIS — R059 Cough, unspecified: Secondary | ICD-10-CM | POA: Diagnosis not present

## 2021-11-12 DIAGNOSIS — J441 Chronic obstructive pulmonary disease with (acute) exacerbation: Secondary | ICD-10-CM | POA: Diagnosis not present

## 2021-11-12 DIAGNOSIS — R569 Unspecified convulsions: Secondary | ICD-10-CM | POA: Diagnosis not present

## 2021-11-12 DIAGNOSIS — I10 Essential (primary) hypertension: Secondary | ICD-10-CM | POA: Diagnosis not present

## 2021-11-12 DIAGNOSIS — Z23 Encounter for immunization: Secondary | ICD-10-CM | POA: Diagnosis not present

## 2021-11-12 DIAGNOSIS — Z299 Encounter for prophylactic measures, unspecified: Secondary | ICD-10-CM | POA: Diagnosis not present

## 2021-11-12 DIAGNOSIS — F17210 Nicotine dependence, cigarettes, uncomplicated: Secondary | ICD-10-CM | POA: Diagnosis not present

## 2021-11-13 ENCOUNTER — Encounter (HOSPITAL_COMMUNITY): Payer: Self-pay | Admitting: Psychiatry

## 2021-11-13 ENCOUNTER — Other Ambulatory Visit (HOSPITAL_COMMUNITY): Payer: Self-pay | Admitting: Psychiatry

## 2021-11-13 ENCOUNTER — Ambulatory Visit (INDEPENDENT_AMBULATORY_CARE_PROVIDER_SITE_OTHER): Payer: Medicare Other | Admitting: Psychiatry

## 2021-11-13 VITALS — BP 120/79 | HR 82 | Ht 63.0 in | Wt 153.2 lb

## 2021-11-13 DIAGNOSIS — F331 Major depressive disorder, recurrent, moderate: Secondary | ICD-10-CM | POA: Diagnosis not present

## 2021-11-13 MED ORDER — QUETIAPINE FUMARATE 200 MG PO TABS: 200.0000 mg | ORAL_TABLET | Freq: Every day | ORAL | 2 refills | Status: DC

## 2021-11-13 MED ORDER — FLUOXETINE HCL 20 MG PO CAPS: 20.0000 mg | ORAL_CAPSULE | Freq: Every day | ORAL | 2 refills | Status: DC

## 2021-11-13 MED ORDER — FLUOXETINE HCL 40 MG PO CAPS: ORAL_CAPSULE | ORAL | 2 refills | Status: DC

## 2021-11-13 MED ORDER — CLONAZEPAM 0.5 MG PO TABS: ORAL_TABLET | ORAL | 2 refills | Status: DC
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
## 2021-11-14 DIAGNOSIS — Z79899 Other long term (current) drug therapy: Secondary | ICD-10-CM | POA: Diagnosis not present

## 2021-11-14 DIAGNOSIS — M5030 Other cervical disc degeneration, unspecified cervical region: Secondary | ICD-10-CM | POA: Diagnosis not present

## 2021-11-14 DIAGNOSIS — M5136 Other intervertebral disc degeneration, lumbar region: Secondary | ICD-10-CM | POA: Diagnosis not present

## 2021-11-14 DIAGNOSIS — G894 Chronic pain syndrome: Secondary | ICD-10-CM | POA: Diagnosis not present

## 2021-11-14 DIAGNOSIS — R030 Elevated blood-pressure reading, without diagnosis of hypertension: Secondary | ICD-10-CM | POA: Diagnosis not present

## 2021-11-18 DIAGNOSIS — Z79899 Other long term (current) drug therapy: Secondary | ICD-10-CM | POA: Diagnosis not present

## 2021-12-18 ENCOUNTER — Other Ambulatory Visit (HOSPITAL_COMMUNITY): Payer: Self-pay | Admitting: Psychiatry

## 2022-02-12 ENCOUNTER — Ambulatory Visit (HOSPITAL_COMMUNITY): Payer: Medicare Other | Admitting: Psychiatry

## 2022-02-19 ENCOUNTER — Ambulatory Visit (HOSPITAL_COMMUNITY): Payer: Medicare Other | Admitting: Psychiatry

## 2022-04-06 ENCOUNTER — Ambulatory Visit (INDEPENDENT_AMBULATORY_CARE_PROVIDER_SITE_OTHER): Payer: 59 | Admitting: Psychiatry

## 2022-04-06 ENCOUNTER — Encounter (HOSPITAL_COMMUNITY): Payer: Self-pay | Admitting: Psychiatry

## 2022-04-06 VITALS — BP 130/83 | HR 72 | Wt 147.0 lb

## 2022-04-06 DIAGNOSIS — F331 Major depressive disorder, recurrent, moderate: Secondary | ICD-10-CM

## 2022-04-06 MED ORDER — VORTIOXETINE HBR 20 MG PO TABS: 20.0000 mg | ORAL_TABLET | Freq: Every day | ORAL | 2 refills | Status: DC

## 2022-04-06 MED ORDER — QUETIAPINE FUMARATE 200 MG PO TABS: 200.0000 mg | ORAL_TABLET | Freq: Every day | ORAL | 2 refills | Status: DC

## 2022-04-06 MED ORDER — CLONAZEPAM 0.5 MG PO TABS: ORAL_TABLET | ORAL | 2 refills | Status: DC
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
## 2022-04-09 ENCOUNTER — Other Ambulatory Visit (HOSPITAL_COMMUNITY): Payer: Self-pay | Admitting: Psychiatry

## 2022-04-28 DIAGNOSIS — I2510 Atherosclerotic heart disease of native coronary artery without angina pectoris: Secondary | ICD-10-CM | POA: Diagnosis not present

## 2022-04-28 DIAGNOSIS — R0603 Acute respiratory distress: Secondary | ICD-10-CM | POA: Diagnosis not present

## 2022-04-28 DIAGNOSIS — J441 Chronic obstructive pulmonary disease with (acute) exacerbation: Secondary | ICD-10-CM | POA: Diagnosis not present

## 2022-04-28 DIAGNOSIS — Z1152 Encounter for screening for COVID-19: Secondary | ICD-10-CM | POA: Diagnosis not present

## 2022-04-28 DIAGNOSIS — I700 Atherosclerosis of aorta: Secondary | ICD-10-CM | POA: Diagnosis not present

## 2022-04-28 DIAGNOSIS — Z888 Allergy status to other drugs, medicaments and biological substances status: Secondary | ICD-10-CM | POA: Diagnosis not present

## 2022-04-28 DIAGNOSIS — G629 Polyneuropathy, unspecified: Secondary | ICD-10-CM | POA: Diagnosis not present

## 2022-04-28 DIAGNOSIS — Z743 Need for continuous supervision: Secondary | ICD-10-CM | POA: Diagnosis not present

## 2022-04-28 DIAGNOSIS — E785 Hyperlipidemia, unspecified: Secondary | ICD-10-CM | POA: Diagnosis not present

## 2022-04-28 DIAGNOSIS — F17210 Nicotine dependence, cigarettes, uncomplicated: Secondary | ICD-10-CM | POA: Diagnosis not present

## 2022-04-28 DIAGNOSIS — J40 Bronchitis, not specified as acute or chronic: Secondary | ICD-10-CM | POA: Diagnosis not present

## 2022-04-28 DIAGNOSIS — R062 Wheezing: Secondary | ICD-10-CM | POA: Diagnosis not present

## 2022-04-28 DIAGNOSIS — K219 Gastro-esophageal reflux disease without esophagitis: Secondary | ICD-10-CM | POA: Diagnosis not present

## 2022-04-28 DIAGNOSIS — J209 Acute bronchitis, unspecified: Secondary | ICD-10-CM | POA: Diagnosis not present

## 2022-04-28 DIAGNOSIS — I499 Cardiac arrhythmia, unspecified: Secondary | ICD-10-CM | POA: Diagnosis not present

## 2022-04-28 DIAGNOSIS — E8729 Other acidosis: Secondary | ICD-10-CM | POA: Diagnosis not present

## 2022-04-28 DIAGNOSIS — I10 Essential (primary) hypertension: Secondary | ICD-10-CM | POA: Diagnosis not present

## 2022-04-28 DIAGNOSIS — M797 Fibromyalgia: Secondary | ICD-10-CM | POA: Diagnosis not present

## 2022-04-28 DIAGNOSIS — Z7901 Long term (current) use of anticoagulants: Secondary | ICD-10-CM | POA: Diagnosis not present

## 2022-04-28 DIAGNOSIS — Z91041 Radiographic dye allergy status: Secondary | ICD-10-CM | POA: Diagnosis not present

## 2022-04-28 DIAGNOSIS — I771 Stricture of artery: Secondary | ICD-10-CM | POA: Diagnosis not present

## 2022-04-28 DIAGNOSIS — R0602 Shortness of breath: Secondary | ICD-10-CM | POA: Diagnosis not present

## 2022-04-28 DIAGNOSIS — I509 Heart failure, unspecified: Secondary | ICD-10-CM | POA: Diagnosis not present

## 2022-04-28 DIAGNOSIS — Z9989 Dependence on other enabling machines and devices: Secondary | ICD-10-CM | POA: Diagnosis not present

## 2022-04-28 DIAGNOSIS — Z885 Allergy status to narcotic agent status: Secondary | ICD-10-CM | POA: Diagnosis not present

## 2022-04-28 DIAGNOSIS — F32A Depression, unspecified: Secondary | ICD-10-CM | POA: Diagnosis not present

## 2022-04-28 DIAGNOSIS — D72829 Elevated white blood cell count, unspecified: Secondary | ICD-10-CM | POA: Diagnosis not present

## 2022-04-28 DIAGNOSIS — J9602 Acute respiratory failure with hypercapnia: Secondary | ICD-10-CM | POA: Diagnosis not present

## 2022-04-28 DIAGNOSIS — Z79899 Other long term (current) drug therapy: Secondary | ICD-10-CM | POA: Diagnosis not present

## 2022-04-28 DIAGNOSIS — I110 Hypertensive heart disease with heart failure: Secondary | ICD-10-CM | POA: Diagnosis not present

## 2022-04-28 DIAGNOSIS — G8929 Other chronic pain: Secondary | ICD-10-CM | POA: Diagnosis not present

## 2022-04-28 DIAGNOSIS — Z7952 Long term (current) use of systemic steroids: Secondary | ICD-10-CM | POA: Diagnosis not present

## 2022-04-28 DIAGNOSIS — M1990 Unspecified osteoarthritis, unspecified site: Secondary | ICD-10-CM | POA: Diagnosis not present

## 2022-04-28 DIAGNOSIS — Z86718 Personal history of other venous thrombosis and embolism: Secondary | ICD-10-CM | POA: Diagnosis not present

## 2022-04-28 DIAGNOSIS — E876 Hypokalemia: Secondary | ICD-10-CM | POA: Diagnosis not present

## 2022-04-29 ENCOUNTER — Inpatient Hospital Stay (HOSPITAL_COMMUNITY)
Admission: EM | Admit: 2022-04-29 | Discharge: 2022-05-01 | DRG: 281 | Disposition: A | Discharge status: Home / Self Care | Payer: 59 | Source: Other Acute Inpatient Hospital | Attending: Family Medicine | Admitting: Family Medicine

## 2022-04-29 ENCOUNTER — Encounter (HOSPITAL_COMMUNITY): Payer: Self-pay

## 2022-04-29 DIAGNOSIS — E559 Vitamin D deficiency, unspecified: Secondary | ICD-10-CM | POA: Diagnosis not present

## 2022-04-29 DIAGNOSIS — E8729 Other acidosis: Secondary | ICD-10-CM | POA: Diagnosis not present

## 2022-04-29 DIAGNOSIS — I249 Acute ischemic heart disease, unspecified: Principal | ICD-10-CM | POA: Diagnosis present

## 2022-04-29 DIAGNOSIS — R059 Cough, unspecified: Secondary | ICD-10-CM | POA: Diagnosis not present

## 2022-04-29 DIAGNOSIS — Z635 Disruption of family by separation and divorce: Secondary | ICD-10-CM

## 2022-04-29 DIAGNOSIS — I10 Essential (primary) hypertension: Secondary | ICD-10-CM | POA: Diagnosis present

## 2022-04-29 DIAGNOSIS — E7800 Pure hypercholesterolemia, unspecified: Secondary | ICD-10-CM | POA: Diagnosis not present

## 2022-04-29 DIAGNOSIS — D72829 Elevated white blood cell count, unspecified: Secondary | ICD-10-CM

## 2022-04-29 DIAGNOSIS — J40 Bronchitis, not specified as acute or chronic: Secondary | ICD-10-CM

## 2022-04-29 DIAGNOSIS — F17210 Nicotine dependence, cigarettes, uncomplicated: Secondary | ICD-10-CM | POA: Diagnosis not present

## 2022-04-29 DIAGNOSIS — F32A Depression, unspecified: Secondary | ICD-10-CM | POA: Diagnosis not present

## 2022-04-29 DIAGNOSIS — Z86718 Personal history of other venous thrombosis and embolism: Secondary | ICD-10-CM

## 2022-04-29 DIAGNOSIS — Z7982 Long term (current) use of aspirin: Secondary | ICD-10-CM

## 2022-04-29 DIAGNOSIS — R079 Chest pain, unspecified: Secondary | ICD-10-CM

## 2022-04-29 DIAGNOSIS — Z91041 Radiographic dye allergy status: Secondary | ICD-10-CM | POA: Diagnosis not present

## 2022-04-29 DIAGNOSIS — Z90710 Acquired absence of both cervix and uterus: Secondary | ICD-10-CM | POA: Diagnosis not present

## 2022-04-29 DIAGNOSIS — Z885 Allergy status to narcotic agent status: Secondary | ICD-10-CM | POA: Diagnosis not present

## 2022-04-29 DIAGNOSIS — Z79899 Other long term (current) drug therapy: Secondary | ICD-10-CM

## 2022-04-29 DIAGNOSIS — Z7901 Long term (current) use of anticoagulants: Secondary | ICD-10-CM

## 2022-04-29 DIAGNOSIS — I214 Non-ST elevation (NSTEMI) myocardial infarction: Secondary | ICD-10-CM | POA: Diagnosis not present

## 2022-04-29 DIAGNOSIS — E785 Hyperlipidemia, unspecified: Secondary | ICD-10-CM | POA: Insufficient documentation

## 2022-04-29 DIAGNOSIS — Z818 Family history of other mental and behavioral disorders: Secondary | ICD-10-CM | POA: Diagnosis not present

## 2022-04-29 DIAGNOSIS — Z87891 Personal history of nicotine dependence: Secondary | ICD-10-CM | POA: Diagnosis not present

## 2022-04-29 DIAGNOSIS — F419 Anxiety disorder, unspecified: Secondary | ICD-10-CM | POA: Diagnosis present

## 2022-04-29 DIAGNOSIS — R0602 Shortness of breath: Secondary | ICD-10-CM | POA: Diagnosis not present

## 2022-04-29 DIAGNOSIS — J9602 Acute respiratory failure with hypercapnia: Secondary | ICD-10-CM | POA: Diagnosis not present

## 2022-04-29 DIAGNOSIS — E876 Hypokalemia: Secondary | ICD-10-CM | POA: Diagnosis not present

## 2022-04-29 DIAGNOSIS — Z811 Family history of alcohol abuse and dependence: Secondary | ICD-10-CM

## 2022-04-29 DIAGNOSIS — R7989 Other specified abnormal findings of blood chemistry: Secondary | ICD-10-CM | POA: Diagnosis not present

## 2022-04-29 DIAGNOSIS — I2510 Atherosclerotic heart disease of native coronary artery without angina pectoris: Secondary | ICD-10-CM | POA: Diagnosis not present

## 2022-04-29 DIAGNOSIS — J441 Chronic obstructive pulmonary disease with (acute) exacerbation: Secondary | ICD-10-CM | POA: Diagnosis not present

## 2022-04-29 DIAGNOSIS — T380X5A Adverse effect of glucocorticoids and synthetic analogues, initial encounter: Secondary | ICD-10-CM | POA: Diagnosis present

## 2022-04-30 ENCOUNTER — Inpatient Hospital Stay (HOSPITAL_COMMUNITY): Payer: 59

## 2022-04-30 ENCOUNTER — Encounter (HOSPITAL_COMMUNITY)
Admission: EM | Disposition: A | Discharge status: Home / Self Care | Payer: Self-pay | Source: Other Acute Inpatient Hospital | Attending: Family Medicine

## 2022-04-30 DIAGNOSIS — Z86718 Personal history of other venous thrombosis and embolism: Secondary | ICD-10-CM

## 2022-04-30 DIAGNOSIS — I249 Acute ischemic heart disease, unspecified: Secondary | ICD-10-CM

## 2022-04-30 DIAGNOSIS — D72829 Elevated white blood cell count, unspecified: Secondary | ICD-10-CM

## 2022-04-30 DIAGNOSIS — R7989 Other specified abnormal findings of blood chemistry: Secondary | ICD-10-CM

## 2022-04-30 DIAGNOSIS — I2510 Atherosclerotic heart disease of native coronary artery without angina pectoris: Secondary | ICD-10-CM

## 2022-04-30 DIAGNOSIS — J40 Bronchitis, not specified as acute or chronic: Secondary | ICD-10-CM

## 2022-04-30 DIAGNOSIS — R079 Chest pain, unspecified: Secondary | ICD-10-CM

## 2022-04-30 DIAGNOSIS — J441 Chronic obstructive pulmonary disease with (acute) exacerbation: Secondary | ICD-10-CM

## 2022-04-30 HISTORY — PX: LEFT HEART CATH AND CORONARY ANGIOGRAPHY: CATH118249

## 2022-04-30 LAB — PROTIME-INR
INR: 1 (ref 0.8–1.2)
Prothrombin Time: 13.2 seconds (ref 11.4–15.2)

## 2022-04-30 LAB — CBC
HCT: 35.6 % — ABNORMAL LOW (ref 36.0–46.0)
Hemoglobin: 12 g/dL (ref 12.0–15.0)
MCH: 31.5 pg (ref 26.0–34.0)
MCHC: 33.7 g/dL (ref 30.0–36.0)
MCV: 93.4 fL (ref 80.0–100.0)
Platelets: 412 10*3/uL — ABNORMAL HIGH (ref 150–400)
RBC: 3.81 MIL/uL — ABNORMAL LOW (ref 3.87–5.11)
RDW: 13.5 % (ref 11.5–15.5)
WBC: 26.9 10*3/uL — ABNORMAL HIGH (ref 4.0–10.5)
nRBC: 0 % (ref 0.0–0.2)

## 2022-04-30 LAB — COMPREHENSIVE METABOLIC PANEL
ALT: 16 U/L (ref 0–44)
AST: 24 U/L (ref 15–41)
Albumin: 3.5 g/dL (ref 3.5–5.0)
Alkaline Phosphatase: 83 U/L (ref 38–126)
Anion gap: 8 (ref 5–15)
BUN: 9 mg/dL (ref 8–23)
CO2: 28 mmol/L (ref 22–32)
Calcium: 8.6 mg/dL — ABNORMAL LOW (ref 8.9–10.3)
Chloride: 100 mmol/L (ref 98–111)
Creatinine, Ser: 0.76 mg/dL (ref 0.44–1.00)
GFR, Estimated: >60 mL/min (ref >60)
Glucose, Bld: 139 mg/dL — ABNORMAL HIGH (ref 70–99)
Potassium: 3.2 mmol/L — ABNORMAL LOW (ref 3.5–5.1)
Sodium: 136 mmol/L (ref 135–145)
Total Bilirubin: 0.4 mg/dL (ref 0.3–1.2)
Total Protein: 6.3 g/dL — ABNORMAL LOW (ref 6.5–8.1)

## 2022-04-30 LAB — TROPONIN I (HIGH SENSITIVITY)
Troponin I (High Sensitivity): 1935 ng/L (ref <18)
Troponin I (High Sensitivity): 1621 ng/L (ref <18)
Troponin I (High Sensitivity): 1568 ng/L (ref <18)

## 2022-04-30 LAB — APTT: aPTT: 27 seconds (ref 24–36)

## 2022-04-30 LAB — BRAIN NATRIURETIC PEPTIDE: B Natriuretic Peptide: 299.8 pg/mL — ABNORMAL HIGH (ref 0.0–100.0)

## 2022-04-30 LAB — HIV ANTIBODY (ROUTINE TESTING W REFLEX): HIV Screen 4th Generation wRfx: NONREACTIVE

## 2022-04-30 LAB — HEPARIN LEVEL (UNFRACTIONATED): Heparin Unfractionated: 0.66 IU/mL (ref 0.30–0.70)

## 2022-04-30 SURGERY — LEFT HEART CATH AND CORONARY ANGIOGRAPHY
Anesthesia: LOCAL

## 2022-04-30 MED ORDER — SODIUM CHLORIDE 0.9 % IV SOLN: INTRAVENOUS | Status: DC

## 2022-04-30 MED ORDER — VERAPAMIL HCL 2.5 MG/ML IV SOLN
INTRAVENOUS | Status: AC
  Filled 2022-04-30: qty 2

## 2022-04-30 MED ORDER — LIDOCAINE HCL (PF) 1 % IJ SOLN
INTRAMUSCULAR | Status: AC
  Filled 2022-04-30: qty 30

## 2022-04-30 MED ORDER — SODIUM CHLORIDE 0.9 % IV SOLN: 250.0000 mL | INTRAVENOUS | Status: DC | PRN

## 2022-04-30 MED ORDER — ROSUVASTATIN CALCIUM 5 MG PO TABS: 5.0000 mg | ORAL_TABLET | Freq: Every day | ORAL | Status: DC

## 2022-04-30 MED ORDER — MIDAZOLAM HCL 2 MG/2ML IJ SOLN
INTRAMUSCULAR | Status: DC | PRN
  Administered 2022-04-30 (×2): 1 mg via INTRAVENOUS

## 2022-04-30 MED ORDER — ONDANSETRON HCL 4 MG/2ML IJ SOLN: 4.0000 mg | Freq: Four times a day (QID) | INTRAMUSCULAR | Status: DC | PRN

## 2022-04-30 MED ORDER — MIDAZOLAM HCL 2 MG/2ML IJ SOLN
INTRAMUSCULAR | Status: AC
  Filled 2022-04-30: qty 2

## 2022-04-30 MED ORDER — HYDRALAZINE HCL 20 MG/ML IJ SOLN: 10.0000 mg | INTRAMUSCULAR | Status: AC | PRN

## 2022-04-30 MED ORDER — APIXABAN 5 MG PO TABS
5.0000 mg | ORAL_TABLET | Freq: Two times a day (BID) | ORAL | Status: DC
  Administered 2022-04-30 – 2022-05-01 (×2): 5 mg via ORAL
  Filled 2022-04-30 (×2): qty 1

## 2022-04-30 MED ORDER — METOPROLOL TARTRATE 25 MG PO TABS
25.0000 mg | ORAL_TABLET | Freq: Two times a day (BID) | ORAL | Status: DC
  Administered 2022-04-30 – 2022-05-01 (×3): 25 mg via ORAL
  Filled 2022-04-30 (×3): qty 1

## 2022-04-30 MED ORDER — ACETAMINOPHEN 650 MG RE SUPP: 650.0000 mg | Freq: Four times a day (QID) | RECTAL | Status: DC | PRN

## 2022-04-30 MED ORDER — FENTANYL CITRATE (PF) 100 MCG/2ML IJ SOLN
INTRAMUSCULAR | Status: DC | PRN
  Administered 2022-04-30 (×2): 25 ug via INTRAVENOUS

## 2022-04-30 MED ORDER — SODIUM CHLORIDE 0.9% FLUSH: 3.0000 mL | Freq: Two times a day (BID) | INTRAVENOUS | Status: DC

## 2022-04-30 MED ORDER — CLOPIDOGREL BISULFATE 75 MG PO TABS
75.0000 mg | ORAL_TABLET | Freq: Every day | ORAL | Status: DC
  Administered 2022-05-01: 75 mg via ORAL
  Filled 2022-04-30: qty 1

## 2022-04-30 MED ORDER — METHYLPREDNISOLONE SODIUM SUCC 125 MG IJ SOLR
80.0000 mg | Freq: Every day | INTRAMUSCULAR | Status: DC
  Administered 2022-04-30: 80 mg via INTRAVENOUS
  Filled 2022-04-30: qty 2

## 2022-04-30 MED ORDER — PREDNISONE 20 MG PO TABS
40.0000 mg | ORAL_TABLET | Freq: Every day | ORAL | Status: DC
  Administered 2022-05-01: 40 mg via ORAL
  Filled 2022-04-30: qty 2

## 2022-04-30 MED ORDER — MORPHINE SULFATE (PF) 2 MG/ML IV SOLN
2.0000 mg | INTRAVENOUS | Status: DC | PRN
  Administered 2022-04-30: 2 mg via INTRAVENOUS
  Filled 2022-04-30: qty 1

## 2022-04-30 MED ORDER — HEPARIN SODIUM (PORCINE) 1000 UNIT/ML IJ SOLN
INTRAMUSCULAR | Status: AC
  Filled 2022-04-30: qty 10

## 2022-04-30 MED ORDER — APIXABAN 5 MG PO TABS: 5.0000 mg | ORAL_TABLET | Freq: Two times a day (BID) | ORAL | Status: DC

## 2022-04-30 MED ORDER — CLOPIDOGREL BISULFATE 75 MG PO TABS
300.0000 mg | ORAL_TABLET | Freq: Once | ORAL | Status: AC
  Administered 2022-04-30: 300 mg via ORAL
  Filled 2022-04-30: qty 4

## 2022-04-30 MED ORDER — FENTANYL CITRATE (PF) 100 MCG/2ML IJ SOLN
INTRAMUSCULAR | Status: AC
  Filled 2022-04-30: qty 2

## 2022-04-30 MED ORDER — NITROGLYCERIN 0.4 MG SL SUBL
0.4000 mg | SUBLINGUAL_TABLET | SUBLINGUAL | Status: DC | PRN
  Administered 2022-04-30 (×2): 0.4 mg via SUBLINGUAL
  Filled 2022-04-30: qty 1

## 2022-04-30 MED ORDER — ACETAMINOPHEN 325 MG PO TABS
650.0000 mg | ORAL_TABLET | Freq: Four times a day (QID) | ORAL | Status: DC | PRN
  Administered 2022-04-30: 650 mg via ORAL
  Filled 2022-04-30 (×2): qty 2

## 2022-04-30 MED ORDER — METHYLPREDNISOLONE SODIUM SUCC 125 MG IJ SOLR
125.0000 mg | Freq: Once | INTRAMUSCULAR | Status: AC
  Administered 2022-04-30: 125 mg via INTRAVENOUS
  Filled 2022-04-30: qty 2

## 2022-04-30 MED ORDER — DIPHENHYDRAMINE HCL 50 MG/ML IJ SOLN
25.0000 mg | Freq: Once | INTRAMUSCULAR | Status: AC
  Administered 2022-04-30: 25 mg via INTRAVENOUS
  Filled 2022-04-30: qty 1

## 2022-04-30 MED ORDER — SODIUM CHLORIDE 0.9% FLUSH: 3.0000 mL | INTRAVENOUS | Status: DC | PRN

## 2022-04-30 MED ORDER — POTASSIUM CHLORIDE CRYS ER 20 MEQ PO TBCR
40.0000 meq | EXTENDED_RELEASE_TABLET | Freq: Once | ORAL | Status: AC
  Administered 2022-04-30: 40 meq via ORAL
  Filled 2022-04-30: qty 2

## 2022-04-30 MED ORDER — ATORVASTATIN CALCIUM 80 MG PO TABS
80.0000 mg | ORAL_TABLET | Freq: Every day | ORAL | Status: DC
  Administered 2022-05-01: 80 mg via ORAL
  Filled 2022-04-30: qty 1

## 2022-04-30 MED ORDER — VERAPAMIL HCL 2.5 MG/ML IV SOLN
INTRAVENOUS | Status: DC | PRN
  Administered 2022-04-30: 10 mL via INTRA_ARTERIAL

## 2022-04-30 MED ORDER — IPRATROPIUM-ALBUTEROL 0.5-2.5 (3) MG/3ML IN SOLN: 3.0000 mL | Freq: Four times a day (QID) | RESPIRATORY_TRACT | Status: DC | PRN

## 2022-04-30 MED ORDER — ASPIRIN 325 MG PO TBEC
325.0000 mg | DELAYED_RELEASE_TABLET | Freq: Every day | ORAL | Status: DC
  Administered 2022-04-30: 325 mg via ORAL
  Filled 2022-04-30: qty 1

## 2022-04-30 MED ORDER — SODIUM CHLORIDE 0.9 % IV SOLN: INTRAVENOUS | Status: AC

## 2022-04-30 MED ORDER — ONDANSETRON HCL 4 MG/2ML IJ SOLN: 4.0000 mg | Freq: Once | INTRAMUSCULAR | Status: DC

## 2022-04-30 MED ORDER — HEPARIN SODIUM (PORCINE) 1000 UNIT/ML IJ SOLN
INTRAMUSCULAR | Status: DC | PRN
  Administered 2022-04-30: 5000 [IU] via INTRAVENOUS

## 2022-04-30 MED ORDER — GUAIFENESIN ER 600 MG PO TB12
600.0000 mg | ORAL_TABLET | Freq: Two times a day (BID) | ORAL | Status: DC
  Administered 2022-04-30 – 2022-05-01 (×3): 600 mg via ORAL
  Filled 2022-04-30 (×4): qty 1

## 2022-04-30 MED ORDER — POTASSIUM CHLORIDE 10 MEQ/100ML IV SOLN
10.0000 meq | INTRAVENOUS | Status: DC
  Administered 2022-04-30 (×2): 10 meq via INTRAVENOUS
  Filled 2022-04-30 (×2): qty 100

## 2022-04-30 MED ORDER — LABETALOL HCL 5 MG/ML IV SOLN: 10.0000 mg | INTRAVENOUS | Status: AC | PRN

## 2022-04-30 MED ORDER — SODIUM CHLORIDE 0.9% FLUSH
3.0000 mL | Freq: Two times a day (BID) | INTRAVENOUS | Status: DC
  Administered 2022-04-30 – 2022-05-01 (×2): 3 mL via INTRAVENOUS

## 2022-04-30 MED ORDER — ACETAMINOPHEN 325 MG PO TABS
650.0000 mg | ORAL_TABLET | ORAL | Status: DC | PRN
  Administered 2022-05-01: 650 mg via ORAL

## 2022-04-30 MED ORDER — LIDOCAINE HCL (PF) 1 % IJ SOLN
INTRAMUSCULAR | Status: DC | PRN
  Administered 2022-04-30: 2 mL

## 2022-04-30 MED ORDER — HEPARIN (PORCINE) IN NACL 1000-0.9 UT/500ML-% IV SOLN
INTRAVENOUS | Status: DC | PRN
  Administered 2022-04-30 (×2): 500 mL

## 2022-04-30 MED ORDER — HEPARIN (PORCINE) 25000 UT/250ML-% IV SOLN
1100.0000 [IU]/h | INTRAVENOUS | Status: DC
  Administered 2022-04-30: 900 [IU]/h via INTRAVENOUS
  Filled 2022-04-30: qty 250

## 2022-04-30 MED ORDER — IOHEXOL 350 MG/ML SOLN
INTRAVENOUS | Status: DC | PRN
  Administered 2022-04-30: 80 mL

## 2022-04-30 SURGICAL SUPPLY — 14 items
CATH DIAG 6FR PIGTAIL ANGLED (CATHETERS) IMPLANT
CATH OPTITORQUE TIG 4.0 6F (CATHETERS) IMPLANT
DEVICE RAD TR BAND REGULAR (VASCULAR PRODUCTS) IMPLANT
GLIDESHEATH SLEND SS 6F .021 (SHEATH) IMPLANT
KIT HEART LEFT (KITS) ×1 IMPLANT
MAT PREVALON FULL STRYKER (MISCELLANEOUS) IMPLANT
PACK CARDIAC CATHETERIZATION (CUSTOM PROCEDURE TRAY) ×1 IMPLANT
SHEATH PROBE COVER 6X72 (BAG) IMPLANT
STOPCOCK MORSE 400PSI 3WAY (MISCELLANEOUS) IMPLANT
SYR MEDRAD MARK V 150ML (SYRINGE) IMPLANT
TRANSDUCER W/STOPCOCK (MISCELLANEOUS) ×1 IMPLANT
TUBING CIL FLEX 10 FLL-RA (TUBING) ×1 IMPLANT
TUBING CONTRAST HIGH PRESS 48 (TUBING) IMPLANT
WIRE EMERALD 3MM-J .035X260CM (WIRE) IMPLANT

## 2022-04-30 NOTE — Progress Notes (Addendum)
Rounding Note    Patient Name: Hayley Juarez Date of Encounter: 04/30/2022  [place] Cardiologist: Hayley Dolly, MD   Subjective  Reports CP this AM Troponin 1935->1621 Hgb 12 Crt normal  Inpatient Medications    Scheduled Meds:  aspirin EC  325 mg Oral Q breakfast   guaiFENesin  600 mg Oral BID   methylPREDNISolone (SOLU-MEDROL) injection  80 mg Intravenous Daily   Continuous Infusions:  heparin 900 Units/hr (04/30/22 0334)   PRN Meds: acetaminophen **OR** acetaminophen, ipratropium-albuterol   Vital Signs    Vitals:   04/29/22 2148 04/30/22 0021 04/30/22 0428 04/30/22 0720  BP: 139/88 138/76 (!) 141/93 126/78  Pulse: 93 91 90 93  Resp: '20 20 20 19  '$ Temp: 98.9 F (37.2 C) 99.5 F (37.5 C) 98.9 F (37.2 C) 98.4 F (36.9 C)  TempSrc: Oral Oral Oral Oral  SpO2: 96% 95% 91% 93%  Weight: 67 kg  65.7 kg   Height: '5\' 3"'$  (1.6 m)       Intake/Output Summary (Last 24 hours) at 04/30/2022 0928 Last data filed at 04/30/2022 [id number] Gross per 24 hour  Intake --  Output 2225 ml  Net -2225 ml      04/30/2022    4:28 AM 04/29/2022    9:48 PM 04/06/2022   11:18 AM  Last 3 Weights  Weight (lbs) 144 lb 14.4 oz 147 lb 11.3 oz   Weight (kg) 65.726 kg 67 kg      Information is confidential and restricted. Go to Review Flowsheets to unlock data.      Telemetry    NSR - Personally Reviewed  ECG    NSR, anterolateral St depressions  - Personally Reviewed  Physical Exam   Vitals:   04/30/22 0428 04/30/22 0720  BP: (!) 141/93 126/78  Pulse: 90 93  Resp: 20 19  Temp: 98.9 F (37.2 C) 98.4 F (36.9 C)  SpO2: 91% 93%    GEN: No acute distress.   Neck: No JVD Cardiac: RRR, no murmurs, rubs, or gallops.  Respiratory: Clear to auscultation bilaterally. GI: Soft, nontender, non-distended  VASC: 2+ radial pulses BL MS: No edema; No deformity. Neuro:  Nonfocal  Psych: Normal affect   Labs    High Sensitivity Troponin:   Recent Labs  Lab  04/30/22 0601 04/30/22 0749  TROPONINIHS 1,935* 1,621*     Chemistry Recent Labs  Lab 04/30/22 0601  NA 136  K 3.2*  CL 100  CO2 28  GLUCOSE 139*  BUN 9  CREATININE 0.76  CALCIUM 8.6*  PROT 6.3*  ALBUMIN 3.5  AST 24  ALT 16  ALKPHOS 83  BILITOT 0.4  GFRNONAA >60  ANIONGAP 8    Lipids No results for input(s): "CHOL", "TRIG", "HDL", "LABVLDL", "LDLCALC", "CHOLHDL" in the last 168 hours.  Hematology Recent Labs  Lab 04/30/22 0601  WBC 26.9*  RBC 3.81*  HGB 12.0  HCT 35.6*  MCV 93.4  MCH 31.5  MCHC 33.7  RDW 13.5  PLT 412*   Thyroid No results for input(s): "TSH", "FREET4" in the last 168 hours.  BNP Recent Labs  Lab 04/30/22 0601  BNP 299.8*    DDimer No results for input(s): "DDIMER" in the last 168 hours.   Radiology    DG CHEST PORT 1 VIEW  Result Date: 04/30/2022 CLINICAL DATA:  Chest pain and cough EXAM: PORTABLE CHEST 1 VIEW COMPARISON:  04/29/2022 FINDINGS: Cardiac shadow is within normal limits. The lungs are well aerated without focal  infiltrate. Mild central vascular congestion is seen. No bony abnormality is noted. Postsurgical changes in the cervical spine are seen. IMPRESSION: Mild central vascular congestion without edema. Electronically Signed   By: Hayley Juarez M.D.   On: 04/30/2022 02:31    Cardiac Studies   TTE 3/36/2021 1. Left ventricular ejection fraction, by estimation, is 65 to 70%. The  left ventricle has hyperdynamic function. The left ventricle has no  regional wall motion abnormalities. Left ventricular diastolic parameters  were normal.   2. Right ventricular systolic function is normal. The right ventricular  size is normal. There is normal pulmonary artery systolic pressure.   3. The mitral valve is normal in structure. No evidence of mitral valve  regurgitation. No evidence of mitral stenosis.   4. The aortic valve has an indeterminant number of cusps. Aortic valve  regurgitation is not visualized. No aortic stenosis is  present.   5. IVC is small, suggesting low RA pressure and hypovolemia.    NM Myoview 04/2019 - low risk  Patient Profile     Hayley Juarez is a 62 y.o. female with a hx of hypertension, hyperlipidemia, DVT in 2021 on Eliquis, depression, anxiety, auditory hallucinations, spinal stenosis, COPD  who is being seen 04/30/2022 for the evaluation of NSTEMI as OSH transfer at the request of Dr. Marlowe Juarez   Assessment & Plan    NSTEMI: noted left sided CP radiating to the L arm; in the setting of COPD exacerbation. Has significant delta trop. She has anterolateral ST depressions; however this is not new. She reports ongoing chest discomfort this AM. Good radial pulses. Prior EF 2021 was normal. Normal crt - NPO - continue IV heparin - eliquis held; last dose yesterday AM; may delay cath - has iodinated contrast allergy - Continue aspirin 81 mg - Atorvastatin 80 mg - restarted home metop - nitro PRN  Hx of DVT - on eliquis  COPD exacerbation: no wheezing, on RA. Per primary team  For questions or updates, please contact [place] Please consult [email or web address] for contact info under     Time Spent Directly with Patient:  I have spent a total of 35 minutes with the patient reviewing hospital notes, telemetry, EKGs, labs and examining the patient as well as establishing an assessment and plan that was discussed personally with the patient.  > 50% of time was spent in direct patient care.     Signed, Hayley Mayo, MD  04/30/2022, 9:28 AM

## 2022-04-30 NOTE — H&P (View-Only) (Signed)
Rounding Note    Patient Name: Hayley Juarez Date of Encounter: 04/30/2022  [place] Cardiologist: Carlyle Dolly, MD   Subjective  Reports CP this AM Troponin 1935->1621 Hgb 12 Crt normal  Inpatient Medications    Scheduled Meds:  aspirin EC  325 mg Oral Q breakfast   guaiFENesin  600 mg Oral BID   methylPREDNISolone (SOLU-MEDROL) injection  80 mg Intravenous Daily   Continuous Infusions:  heparin 900 Units/hr (04/30/22 0334)   PRN Meds: acetaminophen **OR** acetaminophen, ipratropium-albuterol   Vital Signs    Vitals:   04/29/22 2148 04/30/22 0021 04/30/22 0428 04/30/22 0720  BP: 139/88 138/76 (!) 141/93 126/78  Pulse: 93 91 90 93  Resp: '20 20 20 19  '$ Temp: 98.9 F (37.2 C) 99.5 F (37.5 C) 98.9 F (37.2 C) 98.4 F (36.9 C)  TempSrc: Oral Oral Oral Oral  SpO2: 96% 95% 91% 93%  Weight: 67 kg  65.7 kg   Height: '5\' 3"'$  (1.6 m)       Intake/Output Summary (Last 24 hours) at 04/30/2022 0928 Last data filed at 04/30/2022 [id number] Gross per 24 hour  Intake --  Output 2225 ml  Net -2225 ml      04/30/2022    4:28 AM 04/29/2022    9:48 PM 04/06/2022   11:18 AM  Last 3 Weights  Weight (lbs) 144 lb 14.4 oz 147 lb 11.3 oz   Weight (kg) 65.726 kg 67 kg      Information is confidential and restricted. Go to Review Flowsheets to unlock data.      Telemetry    NSR - Personally Reviewed  ECG    NSR, anterolateral St depressions  - Personally Reviewed  Physical Exam   Vitals:   04/30/22 0428 04/30/22 0720  BP: (!) 141/93 126/78  Pulse: 90 93  Resp: 20 19  Temp: 98.9 F (37.2 C) 98.4 F (36.9 C)  SpO2: 91% 93%    GEN: No acute distress.   Neck: No JVD Cardiac: RRR, no murmurs, rubs, or gallops.  Respiratory: Clear to auscultation bilaterally. GI: Soft, nontender, non-distended  VASC: 2+ radial pulses BL MS: No edema; No deformity. Neuro:  Nonfocal  Psych: Normal affect   Labs    High Sensitivity Troponin:   Recent Labs  Lab  04/30/22 0601 04/30/22 0749  TROPONINIHS 1,935* 1,621*     Chemistry Recent Labs  Lab 04/30/22 0601  NA 136  K 3.2*  CL 100  CO2 28  GLUCOSE 139*  BUN 9  CREATININE 0.76  CALCIUM 8.6*  PROT 6.3*  ALBUMIN 3.5  AST 24  ALT 16  ALKPHOS 83  BILITOT 0.4  GFRNONAA >60  ANIONGAP 8    Lipids No results for input(s): "CHOL", "TRIG", "HDL", "LABVLDL", "LDLCALC", "CHOLHDL" in the last 168 hours.  Hematology Recent Labs  Lab 04/30/22 0601  WBC 26.9*  RBC 3.81*  HGB 12.0  HCT 35.6*  MCV 93.4  MCH 31.5  MCHC 33.7  RDW 13.5  PLT 412*   Thyroid No results for input(s): "TSH", "FREET4" in the last 168 hours.  BNP Recent Labs  Lab 04/30/22 0601  BNP 299.8*    DDimer No results for input(s): "DDIMER" in the last 168 hours.   Radiology    DG CHEST PORT 1 VIEW  Result Date: 04/30/2022 CLINICAL DATA:  Chest pain and cough EXAM: PORTABLE CHEST 1 VIEW COMPARISON:  04/29/2022 FINDINGS: Cardiac shadow is within normal limits. The lungs are well aerated without focal  infiltrate. Mild central vascular congestion is seen. No bony abnormality is noted. Postsurgical changes in the cervical spine are seen. IMPRESSION: Mild central vascular congestion without edema. Electronically Signed   By: Inez Catalina M.D.   On: 04/30/2022 02:31    Cardiac Studies   TTE 3/36/2021 1. Left ventricular ejection fraction, by estimation, is 65 to 70%. The  left ventricle has hyperdynamic function. The left ventricle has no  regional wall motion abnormalities. Left ventricular diastolic parameters  were normal.   2. Right ventricular systolic function is normal. The right ventricular  size is normal. There is normal pulmonary artery systolic pressure.   3. The mitral valve is normal in structure. No evidence of mitral valve  regurgitation. No evidence of mitral stenosis.   4. The aortic valve has an indeterminant number of cusps. Aortic valve  regurgitation is not visualized. No aortic stenosis is  present.   5. IVC is small, suggesting low RA pressure and hypovolemia.    NM Myoview 04/2019 - low risk  Patient Profile     Hayley Juarez is a 62 y.o. female with a hx of hypertension, hyperlipidemia, DVT in 2021 on Eliquis, depression, anxiety, auditory hallucinations, spinal stenosis, COPD  who is being seen 04/30/2022 for the evaluation of NSTEMI as OSH transfer at the request of Dr. Marlowe Sax   Assessment & Plan    NSTEMI: noted left sided CP radiating to the L arm; in the setting of COPD exacerbation. Has significant delta trop. She has anterolateral ST depressions; however this is not new. She reports ongoing chest discomfort this AM. Good radial pulses. Prior EF 2021 was normal. Normal crt - NPO - continue IV heparin - eliquis held; last dose yesterday AM; may delay cath - has iodinated contrast allergy - Continue aspirin 81 mg - Atorvastatin 80 mg - restarted home metop - nitro PRN  Hx of DVT - on eliquis  COPD exacerbation: no wheezing, on RA. Per primary team  For questions or updates, please contact [place] Please consult [email or web address] for contact info under     Time Spent Directly with Patient:  I have spent a total of 35 minutes with the patient reviewing hospital notes, telemetry, EKGs, labs and examining the patient as well as establishing an assessment and plan that was discussed personally with the patient.  > 50% of time was spent in direct patient care.     Signed, Janina Mayo, MD  04/30/2022, 9:28 AM

## 2022-04-30 NOTE — Progress Notes (Signed)
Lab notified of critical troponin lab value of 1935. RN paged and notified Florene Glen MD.
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
## 2022-04-30 NOTE — Progress Notes (Signed)
PROGRESS NOTE    Hayley Juarez  [id number] DOB: [birth year] DOA: 04/29/2022 PCP: Monico Blitz, MD  No chief complaint on file.   Brief Narrative:   Hayley Juarez is Hayley Juarez 62 y.o. female with medical history significant of hypertension, hyperlipidemia, DVT in 2021 on Eliquis, depression, anxiety, auditory hallucinations, spinal stenosis, COPD presented to [facility] ED on 3/5 with shortness of breath.  Admitted for COPD exacerbation/bronchitis.  She was treated with bronchodilators and Solu-Medrol.  Required BiPAP intermittently. Chest x-ray per report showing central bronchial thickening compatible with bronchitis (images not available for personal review). Labs done today yesterday showing WBC 28.1, hemoglobin 11.1, platelet count 402k, sodium 138, potassium 3.7, chloride 100, bicarb 27.7, BUN 13, creatinine 0.68, glucose 154, AST 10, ALT 21, alk phos 115, T. bili 0.2.   Patient had leukocytosis on labs but afebrile and blood cultures negative x 24 hours.  While at that facility patient developed left-sided chest pain.  High-sensitivity troponin trended up 25> 57> 1573> 3457.  EKG x 3 without acute changes per documentation (not available for personal review).  VQ scan negative for PE.  She was placed on heparin drip. Physician at [facility] had discussed the case with cardiologist Dr. Claiborne Billings at [facility], recommended admission for echo and further inpatient cardiology workup.   Patient reports 1 week history of progressively worsening dyspnea and productive cough.  States her breathing has improved with treatments at the other hospital, however, she has continued to have left-sided chest pain for the past 1 week.  The pain is constant and radiates to her left arm, worse when she coughs.  Patient states her pain improved after she was given sublingual nitroglycerin at the other hospital this morning.  No other complaints.  Assessment & Plan:   Principal Problem:   ACS (acute coronary  syndrome) (HCC) Active Problems:   Chest pain   Elevated troponin   COPD with acute exacerbation (HCC)   Bronchitis   Leukocytosis   History of DVT (deep vein thrombosis)  NSTEMI Troponin peaked at 3457 at OSH, downtrending here VQ scan at OSH negative for PE Plan for cardiac cath today Aspirin, lipitor, metoprolol    Acute COPD exacerbation/bronchitis Chest x-ray with mild central vascular congestion  Will transition to PO steroids for tmrw Continue scheduled and prn nebs   Leukocytosis Likely related to steroids, will follow    History of DVT Currently on IV heparin due to concern for ACS. Eliquis prior to admission   Hypertension: Currently normotensive. Continuing metoprolol  Lisinopril on hold as well as amlodipine Med rec pending  Hyperlipidemia Crestor on med list, awaiting med rec - will order while awaiting med rec  Depression and anxiety Seroquel and trintellix are on med list, awaiting med rec  Pharmacy med rec pending.    DVT prophylaxis: heparin gtt Code Status: full Family Communication: daughter, sig other at bedside Disposition:   Status is: Inpatient Remains inpatient appropriate because: need for cards eval/procedures   Consultants:  cardiology  Procedures:  none  Antimicrobials:  Anti-infectives (From admission, onward)    None       Subjective: Notes CP  Objective: Vitals:   04/29/22 2148 04/30/22 0021 04/30/22 0428 04/30/22 0720  BP: 139/88 138/76 (!) 141/93 126/78  Pulse: 93 91 90 93  Resp: '20 20 20 19  '$ Temp: 98.9 F (37.2 C) 99.5 F (37.5 C) 98.9 F (37.2 C) 98.4 F (36.9 C)  TempSrc: Oral Oral Oral Oral  SpO2: 96% 95%  91% 93%  Weight: 67 kg  65.7 kg   Height: '5\' 3"'$  (1.6 m)       Intake/Output Summary (Last 24 hours) at 04/30/2022 1256 Last data filed at 04/30/2022 [id number] Gross per 24 hour  Intake --  Output 2225 ml  Net -2225 ml   Filed Weights   04/29/22 2148 04/30/22 0428  Weight: 67 kg 65.7 kg     Examination:  General exam: Appears calm and comfortable  Respiratory system: Clear to auscultation. Respiratory effort normal. Cardiovascular system: RRR, CP with palpation Gastrointestinal system: Abdomen is nondistended, soft and nontender. Central nervous system: Alert and oriented. No focal neurological deficits. Extremities: no LEE    Data Reviewed: I have personally reviewed following labs and imaging studies  CBC: Recent Labs  Lab 04/30/22 0601  WBC 26.9*  HGB 12.0  HCT 35.6*  MCV 93.4  PLT 412*    Basic Metabolic Panel: Recent Labs  Lab 04/30/22 0601  NA 136  K 3.2*  CL 100  CO2 28  GLUCOSE 139*  BUN 9  CREATININE 0.76  CALCIUM 8.6*    GFR: Estimated Creatinine Clearance: 66.4 mL/min (by C-G formula based on SCr of 0.76 mg/dL).  Liver Function Tests: Recent Labs  Lab 04/30/22 0601  AST 24  ALT 16  ALKPHOS 83  BILITOT 0.4  PROT 6.3*  ALBUMIN 3.5    CBG: No results for input(s): "GLUCAP" in the last 168 hours.   No results found for this or any previous visit (from the past 240 hour(s)).       Radiology Studies: DG CHEST PORT 1 VIEW  Result Date: 04/30/2022 CLINICAL DATA:  Chest pain and cough EXAM: PORTABLE CHEST 1 VIEW COMPARISON:  04/29/2022 FINDINGS: Cardiac shadow is within normal limits. The lungs are well aerated without focal infiltrate. Mild central vascular congestion is seen. No bony abnormality is noted. Postsurgical changes in the cervical spine are seen. IMPRESSION: Mild central vascular congestion without edema. Electronically Signed   By: Inez Catalina M.D.   On: 04/30/2022 02:31        Scheduled Meds:  aspirin EC  325 mg Oral Q breakfast   guaiFENesin  600 mg Oral BID   methylPREDNISolone (SOLU-MEDROL) injection  80 mg Intravenous Daily   metoprolol tartrate  25 mg Oral BID   ondansetron (ZOFRAN) IV  4 mg Intravenous Once   Continuous Infusions:  sodium chloride     heparin 1,100 Units/hr (04/30/22 1201)    potassium chloride 10 mEq (04/30/22 1158)     LOS: 1 day    Time spent: over 30 min    Fayrene Helper, MD [facility]   To contact the attending provider between 7A-7P or the covering provider during after hours 7P-7A, please log into the web site [email or web address] and access using universal [place] password for that web site. If you do not have the password, please call the hospital operator.  04/30/2022, 12:56 PM
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
## 2022-04-30 NOTE — TOC Progression Note (Signed)
Transition of Care [facility]) - Progression Note    Patient Details  Name: Hayley Juarez MRN: [id number] Date of Birth: July 25, 1960  [facility] CM/SW Contact  Zenon Mayo, RN Phone Number: 04/30/2022, 10:43 AM  Clinical Narrative:    from home, NSTEMI, trops up and down, for heart cath today.  TOC following.        Expected Discharge Plan and Services                                               Social Determinants of Health (SDOH) Interventions SDOH Screenings   Depression (PHQ2-9): Medium Risk (04/06/2022)  Tobacco Use: High Risk (04/06/2022)    Readmission Risk Interventions     No data to display
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
## 2022-04-30 NOTE — Progress Notes (Signed)
Attempted Echocardiogram, patient went to Cath lab.
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
## 2022-04-30 NOTE — Interval H&P Note (Signed)
History and Physical Interval Note:  04/30/2022 11:38 AM  Hayley Juarez  has presented today for surgery, with the diagnosis of nstemi.  The various methods of treatment have been discussed with the patient and family. After consideration of risks, benefits and other options for treatment, the patient has consented to  Procedure(s): LEFT HEART CATH AND CORONARY ANGIOGRAPHY (N/A) as a surgical intervention.  The patient's history has been reviewed, patient examined, no change in status, stable for surgery.  I have reviewed the patient's chart and labs.  Questions were answered to the patient's satisfaction.    Cath Lab Visit (complete for each Cath Lab visit)  Clinical Evaluation Leading to the Procedure:   ACS: Yes.    Non-ACS:    Anginal Classification: CCS IV  Anti-ischemic medical therapy: Maximal Therapy (2 or more classes of medications)  Non-Invasive Test Results: No non-invasive testing performed  Prior CABG: No previous CABG        Early [place]

## 2022-04-30 NOTE — Progress Notes (Signed)
Patient returned to floor with radial band inflated. No s/s of bleeding.
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
## 2022-05-01 ENCOUNTER — Encounter (HOSPITAL_COMMUNITY): Payer: Self-pay | Admitting: Internal Medicine

## 2022-05-01 ENCOUNTER — Other Ambulatory Visit (HOSPITAL_COMMUNITY): Payer: Self-pay

## 2022-05-01 ENCOUNTER — Inpatient Hospital Stay (HOSPITAL_COMMUNITY): Payer: 59

## 2022-05-01 DIAGNOSIS — I214 Non-ST elevation (NSTEMI) myocardial infarction: Secondary | ICD-10-CM

## 2022-05-01 LAB — PHOSPHORUS: Phosphorus: 2.8 mg/dL (ref 2.5–4.6)

## 2022-05-01 LAB — BASIC METABOLIC PANEL
Anion gap: 8 (ref 5–15)
BUN: 14 mg/dL (ref 8–23)
CO2: 24 mmol/L (ref 22–32)
Calcium: 8.9 mg/dL (ref 8.9–10.3)
Chloride: 103 mmol/L (ref 98–111)
Creatinine, Ser: 0.69 mg/dL (ref 0.44–1.00)
GFR, Estimated: >60 mL/min (ref >60)
Glucose, Bld: 126 mg/dL — ABNORMAL HIGH (ref 70–99)
Potassium: 3.8 mmol/L (ref 3.5–5.1)
Sodium: 135 mmol/L (ref 135–145)

## 2022-05-01 LAB — CBC
HCT: 38.6 % (ref 36.0–46.0)
Hemoglobin: 12.9 g/dL (ref 12.0–15.0)
MCH: 31.2 pg (ref 26.0–34.0)
MCHC: 33.4 g/dL (ref 30.0–36.0)
MCV: 93.5 fL (ref 80.0–100.0)
Platelets: 449 10*3/uL — ABNORMAL HIGH (ref 150–400)
RBC: 4.13 MIL/uL (ref 3.87–5.11)
RDW: 13.2 % (ref 11.5–15.5)
WBC: 27.7 10*3/uL — ABNORMAL HIGH (ref 4.0–10.5)
nRBC: 0 % (ref 0.0–0.2)

## 2022-05-01 LAB — ECHOCARDIOGRAM COMPLETE
AR max vel: 1.82 cm2
AV Area VTI: 1.82 cm2
AV Area mean vel: 1.77 cm2
AV Mean grad: 5 mmHg
AV Peak grad: 9.2 mmHg
Ao pk vel: 1.52 m/s
Area-P 1/2: 3.99 cm2
Height: 63 in
Weight: 2273.6 oz

## 2022-05-01 LAB — LIPID PANEL
Cholesterol: 188 mg/dL (ref 0–200)
HDL: 46 mg/dL (ref >40)
LDL Cholesterol: 99 mg/dL (ref 0–99)
Total CHOL/HDL Ratio: 4.1 RATIO
Triglycerides: 213 mg/dL — ABNORMAL HIGH (ref <150)
VLDL: 43 mg/dL — ABNORMAL HIGH (ref 0–40)

## 2022-05-01 LAB — MAGNESIUM: Magnesium: 2.4 mg/dL (ref 1.7–2.4)

## 2022-05-01 MED ORDER — PREDNISONE 20 MG PO TABS
40.0000 mg | ORAL_TABLET | Freq: Every day | ORAL | 0 refills | Status: AC
  Filled 2022-05-01: qty 4, 2d supply, fill #0

## 2022-05-01 MED ORDER — EZETIMIBE 10 MG PO TABS
10.0000 mg | ORAL_TABLET | Freq: Every day | ORAL | Status: DC
  Administered 2022-05-01: 10 mg via ORAL
  Filled 2022-05-01: qty 1

## 2022-05-01 MED ORDER — ATORVASTATIN CALCIUM 80 MG PO TABS
80.0000 mg | ORAL_TABLET | Freq: Every day | ORAL | 1 refills | Status: DC
  Filled 2022-05-01: qty 30, 30d supply, fill #0

## 2022-05-01 MED ORDER — DICLOFENAC SODIUM 1 % EX GEL
2.0000 g | Freq: Four times a day (QID) | CUTANEOUS | 0 refills | Status: DC
  Filled 2022-05-01: qty 100, 13d supply, fill #0

## 2022-05-01 MED ORDER — TIOTROPIUM BROMIDE MONOHYDRATE 18 MCG IN CAPS
18.0000 ug | ORAL_CAPSULE | Freq: Every day | RESPIRATORY_TRACT | 1 refills | Status: DC
  Filled 2022-05-01: qty 30, 30d supply, fill #0

## 2022-05-01 MED ORDER — EZETIMIBE 10 MG PO TABS
10.0000 mg | ORAL_TABLET | Freq: Every day | ORAL | 1 refills | Status: DC
  Filled 2022-05-01: qty 30, 30d supply, fill #0

## 2022-05-01 MED ORDER — CLOPIDOGREL BISULFATE 75 MG PO TABS
75.0000 mg | ORAL_TABLET | Freq: Every day | ORAL | 5 refills | Status: AC
  Filled 2022-05-01: qty 30, 30d supply, fill #0

## 2022-05-01 MED ORDER — CLONAZEPAM 0.5 MG PO TABS
0.5000 mg | ORAL_TABLET | Freq: Every day | ORAL | Status: DC | PRN
  Administered 2022-05-01: 0.5 mg via ORAL
  Filled 2022-05-01: qty 1

## 2022-05-01 MED ORDER — DICLOFENAC SODIUM 1 % EX GEL
2.0000 g | Freq: Four times a day (QID) | CUTANEOUS | Status: DC
  Administered 2022-05-01: 2 g via TOPICAL
  Filled 2022-05-01: qty 100

## 2022-05-01 NOTE — Progress Notes (Signed)
SATURATION QUALIFICATIONS:  Patient Saturations on Room Air at Rest = 97%  Patient Saturations on Room Air while Ambulating = 96%

## 2022-05-01 NOTE — Discharge Instructions (Signed)

## 2022-05-01 NOTE — TOC Transition Note (Signed)
Transition of Care [facility]) - CM/SW Discharge Note   Patient Details  Name: Hayley Juarez MRN: [id number] Date of Birth: 08/05/1960  [facility]) CM/SW Contact:  Zenon Mayo, RN Phone Number: 05/01/2022, 11:15 AM   Clinical Narrative:    Patient is for dc today, has no needs.  TOC to do meds.         Patient Goals and CMS Choice      Discharge Placement                         Discharge Plan and Services Additional resources added to the After Visit Summary for                                       Social Determinants of Health (SDOH) Interventions SDOH Screenings   Depression (PHQ2-9): Medium Risk (04/06/2022)  Tobacco Use: High Risk (05/01/2022)     Readmission Risk Interventions     No data to display

## 2022-05-01 NOTE — Progress Notes (Signed)
Rounding Note    Patient Name: Hayley Juarez Date of Encounter: 05/01/2022  [place] Cardiologist: Carlyle Dolly, MD   Subjective  She feels well this AM. We discussed her LHC findings.  Inpatient Medications    Scheduled Meds:  apixaban  5 mg Oral BID   atorvastatin  80 mg Oral Daily   clopidogrel  75 mg Oral Q breakfast   ezetimibe  10 mg Oral Daily   guaiFENesin  600 mg Oral BID   metoprolol tartrate  25 mg Oral BID   ondansetron (ZOFRAN) IV  4 mg Intravenous Once   predniSONE  40 mg Oral Q breakfast   sodium chloride flush  3 mL Intravenous Q12H   sodium chloride flush  3 mL Intravenous Q12H   Continuous Infusions:  sodium chloride     sodium chloride     PRN Meds: sodium chloride, sodium chloride, acetaminophen **OR** acetaminophen, acetaminophen, ipratropium-albuterol, morphine injection, nitroGLYCERIN, ondansetron (ZOFRAN) IV, sodium chloride flush, sodium chloride flush   Vital Signs    Vitals:   04/30/22 2000 05/01/22 0022 05/01/22 0540 05/01/22 0750  BP: (!) 148/88 (!) 136/99 (!) 146/97 129/87  Pulse: 77 71  81  Resp:  (!) '21 20 19  '$ Temp: 98.9 F (37.2 C) 98.3 F (36.8 C) 99.2 F (37.3 C) 99.2 F (37.3 C)  TempSrc: Oral Oral Oral Oral  SpO2: 90% 94% 92% 97%  Weight:   64.5 kg   Height:        Intake/Output Summary (Last 24 hours) at 05/01/2022 0844 Last data filed at 05/01/2022 0600 Gross per 24 hour  Intake 240 ml  Output 1200 ml  Net -960 ml      05/01/2022    5:40 AM 04/30/2022    4:28 AM 04/29/2022    9:48 PM  Last 3 Weights  Weight (lbs) 142 lb 1.6 oz 144 lb 14.4 oz 147 lb 11.3 oz  Weight (kg) 64.456 kg 65.726 kg 67 kg      Telemetry    NSR - Personally Reviewed  ECG    No new anterolateral St depressions  - Personally Reviewed  Physical Exam   Vitals:   05/01/22 0540 05/01/22 0750  BP: (!) 146/97 129/87  Pulse:  81  Resp: 20 19  Temp: 99.2 F (37.3 C) 99.2 F (37.3 C)  SpO2: 92% 97%    GEN: No acute  distress.   Neck: No JVD Cardiac: RRR, no murmurs, rubs, or gallops.  Respiratory: Clear to auscultation bilaterally. GI: Soft, nontender, non-distended  VASC: 2+ radial pulses BL MS: No edema; No deformity. Neuro:  Nonfocal  Psych: Normal affect   Labs    High Sensitivity Troponin:   Recent Labs  Lab 04/30/22 0601 04/30/22 0749 04/30/22 1015  TROPONINIHS 1,935* 1,621* 1,568*     Chemistry Recent Labs  Lab 04/30/22 0601 05/01/22 0052  NA 136 135  K 3.2* 3.8  CL 100 103  CO2 28 24  GLUCOSE 139* 126*  BUN 9 14  CREATININE 0.76 0.69  CALCIUM 8.6* 8.9  MG  --  2.4  PROT 6.3*  --   ALBUMIN 3.5  --   AST 24  --   ALT 16  --   ALKPHOS 83  --   BILITOT 0.4  --   GFRNONAA >60 >60  ANIONGAP 8 8    Lipids  Recent Labs  Lab 05/01/22 0052  CHOL 188  TRIG 213*  HDL 46  LDLCALC 99  CHOLHDL  4.1    Hematology Recent Labs  Lab 04/30/22 0601 05/01/22 0052  WBC 26.9* 27.7*  RBC 3.81* 4.13  HGB 12.0 12.9  HCT 35.6* 38.6  MCV 93.4 93.5  MCH 31.5 31.2  MCHC 33.7 33.4  RDW 13.5 13.2  PLT 412* 449*   Thyroid No results for input(s): "TSH", "FREET4" in the last 168 hours.  BNP Recent Labs  Lab 04/30/22 0601  BNP 299.8*    DDimer No results for input(s): "DDIMER" in the last 168 hours.   Radiology    CARDIAC CATHETERIZATION  Result Date: 04/30/2022   Dist LAD lesion is 60% stenosed.   LPAV lesion is 85% stenosed.   Dist RCA lesion is 70% stenosed. 1.  Disease of the distal LAD, distal circumflex, and distal right coronary artery.  The right coronary artery is severely tortuous.  Given the lack of high-grade proximal obstructive disease PCI was deferred. 2.  Ventriculography and left heart catheterization demonstrated preserved ejection fraction and LVEDP of 17 mmHg. Recommendation: Medical therapy.   DG CHEST PORT 1 VIEW  Result Date: 04/30/2022 CLINICAL DATA:  Chest pain and cough EXAM: PORTABLE CHEST 1 VIEW COMPARISON:  04/29/2022 FINDINGS: Cardiac shadow is  within normal limits. The lungs are well aerated without focal infiltrate. Mild central vascular congestion is seen. No bony abnormality is noted. Postsurgical changes in the cervical spine are seen. IMPRESSION: Mild central vascular congestion without edema. Electronically Signed   By: Inez Catalina M.D.   On: 04/30/2022 02:31    Cardiac Studies   [facility] 04/30/2022   Dist LAD lesion is 60% stenosed.   LPAV lesion is 85% stenosed.   Dist RCA lesion is 70% stenosed.   1.  Disease of the distal LAD, distal circumflex, and distal right coronary artery.  The right coronary artery is severely tortuous.  Given the lack of high-grade proximal obstructive disease PCI was deferred. 2.  Ventriculography and left heart catheterization demonstrated preserved ejection fraction and LVEDP of 17 mmHg.   Recommendation: Medical therapy.  TTE 3/36/2021 1. Left ventricular ejection fraction, by estimation, is 65 to 70%. The  left ventricle has hyperdynamic function. The left ventricle has no  regional wall motion abnormalities. Left ventricular diastolic parameters  were normal.   2. Right ventricular systolic function is normal. The right ventricular  size is normal. There is normal pulmonary artery systolic pressure.   3. The mitral valve is normal in structure. No evidence of mitral valve  regurgitation. No evidence of mitral stenosis.   4. The aortic valve has an indeterminant number of cusps. Aortic valve  regurgitation is not visualized. No aortic stenosis is present.   5. IVC is small, suggesting low RA pressure and hypovolemia.    NM Myoview 04/2019 - low risk  Patient Profile     Hayley Juarez is a 62 y.o. female with a hx of hypertension, hyperlipidemia, DVT in 2021 on Eliquis, depression, anxiety, auditory hallucinations, spinal stenosis, COPD  who is being seen 04/30/2022 for the evaluation of NSTEMI as OSH transfer at the request of Dr. Marlowe Sax s/p LHC with stable CAD  Assessment & Plan     NSTEMI: noted left sided CP radiating to the L arm; in the setting of COPD exacerbation. Has significant delta trop. She has anterolateral ST depressions; however this is not new. She reports ongoing chest discomfort this AM. Good radial pulses. Prior EF 2021 was normal. Normal crt - s/p LHC , had no significant proximal disease. Plan for medical  management for CAD - plan for plavix + eliquis for 6 months, then eliquis -pending echo, saw a few windows and LVEF looks normal - continue lipitor 80 mg daily, LDL not at goal 99, adding zetia 10 mg daily. Goal at least <70 mg/dL - metoprolol tartrate 25 mg BID - nitro PRN  Hx of DVT - on eliquis  COPD exacerbation: no wheezing, on RA. Per primary team  She has FU 05/06/2022. She can be discharged from a cardiology standpoint.  For questions or updates, please contact [place] Please consult [email or web address] for contact info under     Time Spent Directly with Patient:  I have spent a total of 35 minutes with the patient reviewing hospital notes, telemetry, EKGs, labs and examining the patient as well as establishing an assessment and plan that was discussed personally with the patient.  > 50% of time was spent in direct patient care.     Signed, Janina Mayo, MD  05/01/2022, 8:44 AM

## 2022-05-01 NOTE — Progress Notes (Signed)
Echocardiogram 2D Echocardiogram has been performed.  Ronny Flurry 05/01/2022, 9:24 AM

## 2022-05-01 NOTE — Care Management Important Message (Signed)
Important Message  Patient Details  Name: Hayley Juarez MRN: [id number] Date of Birth: 12-26-1960   Medicare Important Message Given:  Yes     Shelda Altes 05/01/2022, 8:40 AM

## 2022-05-01 NOTE — Discharge Summary (Signed)
Physician Discharge Summary  Hayley Juarez [id number] DOB: April 30, 1960 DOA: 04/29/2022  PCP: Monico Blitz, MD  Admit date: 04/29/2022 Discharge date: 05/01/2022  Time spent: 40 minutes  Recommendations for Outpatient Follow-up:  Follow outpatient CBC/CMP  Follow with cardiology outpatient  Follow with PCP outpatient for COPD management, started on spiriva, needs PFT's if not done yet Repeat CXR outpatient   Discharge Diagnoses:  Principal Problem:   ACS (acute coronary syndrome) [facility]) Active Problems:   Chest pain   Elevated troponin   COPD with acute exacerbation ([place])   Bronchitis   Leukocytosis   History of DVT (deep vein thrombosis)   Discharge Condition: stable  Diet recommendation: heart healthy  Filed Weights   04/29/22 2148 04/30/22 0428 05/01/22 0540  Weight: 67 kg 65.7 kg 64.5 kg    History of present illness:   Hayley Juarez is Hayley Juarez 62 y.o. female with medical history significant of hypertension, hyperlipidemia, DVT in 2021 on Eliquis, depression, anxiety, auditory hallucinations, spinal stenosis, COPD presented to [facility] ED on 3/5 with shortness of breath.  Admitted for COPD exacerbation/bronchitis.  She was treated with bronchodilators and Solu-Medrol.  Required BiPAP intermittently. Chest x-ray per report showing central bronchial thickening compatible with bronchitis (images not available for personal review). Labs done today yesterday showing WBC 28.1, hemoglobin 11.1, platelet count 402k, sodium 138, potassium 3.7, chloride 100, bicarb 27.7, BUN 13, creatinine 0.68, glucose 154, AST 10, ALT 21, alk phos 115, T. bili 0.2.   Patient had leukocytosis on labs but afebrile and blood cultures negative x 24 hours.  While at that facility patient developed left-sided chest pain.  High-sensitivity troponin trended up 25> 57> 1573> 3457.  EKG x 3 without acute changes per documentation (not available for personal review).  VQ scan negative for PE.  She was placed  on heparin drip. Physician at [facility] had discussed the case with cardiologist Dr. Claiborne Billings at [facility], recommended admission for echo and further inpatient cardiology workup.   Patient reports 1 week history of progressively worsening dyspnea and productive cough.  States her breathing has improved with treatments at the other hospital, however, she has continued to have left-sided chest pain for the past 1 week.  The pain is constant and radiates to her left arm, worse when she coughs.  Patient states her pain improved after she was given sublingual nitroglycerin at the other hospital this morning.  No other complaints.  She was transferred to [facility] for NSTEMI.  Now s/p catheterization, recommending medical therapy.  COPD/SOB has improved.  Stable for discharge 3/8.  Hospital Course:  Assessment and Plan:  NSTEMI Troponin peaked at 3457 at OSH, downtrending here VQ scan at OSH negative for PE S/p cath, cardiology recommending medical therapy - eliquis/plavix x6 months, then eliquis alone Aspirin, lipitor, zetia, metoprolol    Acute COPD exacerbation/bronchitis Chest x-ray with mild central vascular congestion  D/c with short course of steroids Continue prn albuterol, will start on spiriva as controller med Follow with PCP   Leukocytosis Likely related to steroids, follow outpatient   History of DVT Resume eliquis at discharge   Hypertension: Currently normotensive. Resume home meds at discharge   Hyperlipidemia D/c crestor, start atorvastatin   Depression and anxiety Resume seroquel and trintellix as well as klonopin at discharge      Procedures: Echo IMPRESSIONS     1. Left ventricular ejection fraction, by estimation, is 55 to 60%. The  left ventricle has normal function. The left ventricle has  no regional  wall motion abnormalities. There is mild concentric left ventricular  hypertrophy. Left ventricular diastolic  parameters are consistent with Grade I  diastolic dysfunction (impaired  relaxation).   2. Right ventricular systolic function is normal. The right ventricular  size is normal. Tricuspid regurgitation signal is inadequate for assessing  PA pressure.   3. Left atrial size was mildly dilated.   4. The mitral valve is normal in structure. No evidence of mitral valve  regurgitation. No evidence of mitral stenosis.   5. The aortic valve is tricuspid. There is mild calcification of the  aortic valve. Aortic valve regurgitation is not visualized. No aortic  stenosis is present.   6. The inferior vena cava is normal in size with greater than 50%  respiratory variability, suggesting right atrial pressure of 3 mmHg.   LHC   Dist LAD lesion is 60% stenosed.   LPAV lesion is 85% stenosed.   Dist RCA lesion is 70% stenosed.   1.  Disease of the distal LAD, distal circumflex, and distal right coronary artery.  The right coronary artery is severely tortuous.  Given the lack of high-grade proximal obstructive disease PCI was deferred. 2.  Ventriculography and left heart catheterization demonstrated preserved ejection fraction and LVEDP of 17 mmHg.   Recommendation: Medical therapy.  Consultations: cardiology  Discharge Exam: Vitals:   05/01/22 0540 05/01/22 0750  BP: (!) 146/97 129/87  Pulse:  81  Resp: 20 19  Temp: 99.2 F (37.3 C) 99.2 F (37.3 C)  SpO2: 92% 97%   Eager to d/c   General: No acute distress. Cardiovascular: chest pain to L side, reproduced with palpation, RRR Lungs: Clear to auscultation bilaterally  Abdomen: Soft, nontender, nondistended Neurological: Alert and oriented 3. Moves all extremities 4 with equal strength. Cranial nerves II through XII grossly intact. Extremities: No clubbing or cyanosis. No edema.  Discharge Instructions   Discharge Instructions     Call MD for:  difficulty breathing, headache or visual disturbances   Complete by: As directed    Call MD for:  extreme fatigue   Complete  by: As directed    Call MD for:  hives   Complete by: As directed    Call MD for:  persistant dizziness or light-headedness   Complete by: As directed    Call MD for:  persistant nausea and vomiting   Complete by: As directed    Call MD for:  redness, tenderness, or signs of infection (pain, swelling, redness, odor or green/yellow discharge around incision site)   Complete by: As directed    Call MD for:  severe uncontrolled pain   Complete by: As directed    Call MD for:  temperature >100.4   Complete by: As directed    Diet - low sodium heart healthy   Complete by: As directed    Discharge instructions   Complete by: As directed    You were admitted to [facility] for Chyler Creely heart attack (NSTEMI).  You had Naydene Kamrowski heart catheterization and cardiology recommended medical therapy for your coronary artery disease.  You'll discharge with Darlene Brozowski plan to continue eliquis.  We'll start you on plavix (continue this for 6 months).  We'll start you on lipitor and zetia as well.  The chest pain that you have now seems musculoskeletal.  Try voltaren gel to see if this helps with your symptoms.  Your COPD exacerbation has improved.  We'll send you home with Arul Farabee couple doses of prednisone.  Continue your  albuterol as needed.  I'll start you on spiriva as Genea Rheaume controller medicine.  Follow this up with your PCP as an outpatient.  Follow up with cardiology as scheduled (05/06/2022).  Return for new, recurrent, or worsening symptoms.  Please ask your PCP to request records from this hospitalization so they know what was done and what the next steps will be.   Increase activity slowly   Complete by: As directed       Allergies as of 05/01/2022       Reactions   Hydrocodone Itching   Iodinated Contrast Media Shortness Of Breath   Iodine-131 Shortness Of Breath        Medication List     STOP taking these medications    pregabalin 75 MG capsule Commonly known as: LYRICA   rosuvastatin 5 MG tablet Commonly  known as: CRESTOR       TAKE these medications    albuterol 108 (90 Base) MCG/ACT inhaler Commonly known as: VENTOLIN HFA Inhale 1 puff into the lungs every 6 (six) hours as needed for shortness of breath.   amLODipine 10 MG tablet Commonly known as: NORVASC Take 10 mg by mouth daily.   apixaban 5 MG Tabs tablet Commonly known as: ELIQUIS Take 5 mg by mouth 2 (two) times daily.   atorvastatin 80 MG tablet Commonly known as: LIPITOR Take 1 tablet (80 mg total) by mouth daily. Start taking on: May 02, 2022   calcium carbonate 600 MG Tabs tablet Commonly known as: OS-CAL Take 600 mg by mouth 2 (two) times daily.   clonazePAM 0.5 MG tablet Commonly known as: KLONOPIN TAKE 1 TABLET(0.5 MG) BY MOUTH TWICE DAILY AS NEEDED FOR ANXIETY What changed:  how much to take how to take this when to take this reasons to take this additional instructions   clopidogrel 75 MG tablet Commonly known as: PLAVIX Take 1 tablet (75 mg total) by mouth daily with breakfast. Start taking on: May 02, 2022   CO Q-10 PO Take 1 capsule by mouth daily.   diclofenac Sodium 1 % Gel Commonly known as: VOLTAREN Apply 2 g topically 4 (four) times daily. To musculoskeletal pain as needed   ezetimibe 10 MG tablet Commonly known as: ZETIA Take 1 tablet (10 mg total) by mouth daily. Start taking on: May 02, 2022   lisinopril 40 MG tablet Commonly known as: ZESTRIL Take 40 mg by mouth daily.   metoprolol tartrate 25 MG tablet Commonly known as: LOPRESSOR Take 25 mg by mouth 2 (two) times daily.   predniSONE 20 MG tablet Commonly known as: DELTASONE Take 2 tablets (40 mg total) by mouth daily with breakfast for 2 days. Start taking on: May 02, 2022   QUEtiapine 200 MG tablet Commonly known as: SEROQUEL Take 1 tablet (200 mg total) by mouth at bedtime.   tiotropium 18 MCG inhalation capsule Commonly known as: Spiriva HandiHaler Place 1 capsule (18 mcg total) into inhaler and inhale  daily.   Vitamin D2 10 MCG (400 UNIT) Tabs Take 400 mg by mouth daily.   vortioxetine HBr 20 MG Tabs tablet Commonly known as: TRINTELLIX Take 1 tablet (20 mg total) by mouth daily.       Allergies  Allergen Reactions   Hydrocodone Itching   Iodinated Contrast Media Shortness Of Breath   Iodine-131 Shortness Of Breath    Follow-up Information     Monico Blitz, MD. Go on 05/07/2022.   Specialty: Internal Medicine Why: '@2'$ :00pm Contact information: [address]  [place] [address] [phone]                  The results of significant diagnostics from this hospitalization (including imaging, microbiology, ancillary and laboratory) are listed below for reference.    Significant Diagnostic Studies: ECHOCARDIOGRAM COMPLETE  Result Date: 05/01/2022    ECHOCARDIOGRAM REPORT   Patient Name:   DAVEN HOCHMAN Date of Exam: 05/01/2022 Medical Rec #:  [id number]         Height:       63.0 in Accession #:    [id number]        Weight:       142.1 lb Date of Birth:  May 15, 1960          BSA:          1.672 m Patient Age:    25 years          BP:           146/97 mmHg Patient Gender: F                 HR:           79 bpm. Exam Location:  Inpatient Procedure: 2D Echo, Cardiac Doppler and Color Doppler Indications:    NSTEMI I21.4  History:        Patient has prior history of Echocardiogram examinations, most                 recent 05/19/2019. COPD, Signs/Symptoms:Chest Pain; Risk                 Factors:Hypertension and Current Smoker.  Sonographer:    Ronny Flurry Referring Phys: [id number] VASUNDHRA RATHORE  Sonographer Comments: Image acquisition challenging due to respiratory motion. IMPRESSIONS  1. Left ventricular ejection fraction, by estimation, is 55 to 60%. The left ventricle has normal function. The left ventricle has no regional wall motion abnormalities. There is mild concentric left ventricular hypertrophy. Left ventricular diastolic parameters are consistent with Grade I  diastolic dysfunction (impaired relaxation).  2. Right ventricular systolic function is normal. The right ventricular size is normal. Tricuspid regurgitation signal is inadequate for assessing PA pressure.  3. Left atrial size was mildly dilated.  4. The mitral valve is normal in structure. No evidence of mitral valve regurgitation. No evidence of mitral stenosis.  5. The aortic valve is tricuspid. There is mild calcification of the aortic valve. Aortic valve regurgitation is not visualized. No aortic stenosis is present.  6. The inferior vena cava is normal in size with greater than 50% respiratory variability, suggesting right atrial pressure of 3 mmHg. FINDINGS  Left Ventricle: Left ventricular ejection fraction, by estimation, is 55 to 60%. The left ventricle has normal function. The left ventricle has no regional wall motion abnormalities. The left ventricular internal cavity size was normal in size. There is  mild concentric left ventricular hypertrophy. Left ventricular diastolic parameters are consistent with Grade I diastolic dysfunction (impaired relaxation). Right Ventricle: The right ventricular size is normal. No increase in right ventricular wall thickness. Right ventricular systolic function is normal. Tricuspid regurgitation signal is inadequate for assessing PA pressure. Left Atrium: Left atrial size was mildly dilated. Right Atrium: Right atrial size was normal in size. Pericardium: There is no evidence of pericardial effusion. Mitral Valve: The mitral valve is normal in structure. No evidence of mitral valve regurgitation. No evidence of mitral valve stenosis. Tricuspid Valve: The tricuspid valve is normal in structure. Tricuspid valve regurgitation is not demonstrated.  Aortic Valve: The aortic valve is tricuspid. There is mild calcification of the aortic valve. Aortic valve regurgitation is not visualized. No aortic stenosis is present. Aortic valve mean gradient measures 5.0 mmHg. Aortic valve  peak gradient measures 9.2 mmHg. Aortic valve area, by VTI measures 1.82 cm. Pulmonic Valve: The pulmonic valve was normal in structure. Pulmonic valve regurgitation is not visualized. Aorta: The aortic root is normal in size and structure. Venous: The inferior vena cava is normal in size with greater than 50% respiratory variability, suggesting right atrial pressure of 3 mmHg. IAS/Shunts: No atrial level shunt detected by color flow Doppler.  LEFT VENTRICLE PLAX 2D LVOT diam:     1.90 cm   Diastology LV SV:         52        LV e' medial:    7.70 cm/s LV SV Index:   31        LV E/e' medial:  7.7 LVOT Area:     2.84 cm  LV e' lateral:   7.62 cm/s                          LV E/e' lateral: 7.8  RIGHT VENTRICLE             IVC RV S prime:     16.80 cm/s  IVC diam: 1.00 cm TAPSE (M-mode): 1.8 cm LEFT ATRIUM             Index        RIGHT ATRIUM           Index LA Vol (A2C):   52.9 ml 31.63 ml/m  RA Area:     10.20 cm LA Vol (A4C):   55.7 ml 33.31 ml/m  RA Volume:   19.70 ml  11.78 ml/m LA Biplane Vol: 56.5 ml 33.79 ml/m  AORTIC VALVE AV Area (Vmax):    1.82 cm AV Area (Vmean):   1.77 cm AV Area (VTI):     1.82 cm AV Vmax:           152.00 cm/s AV Vmean:          99.900 cm/s AV VTI:            0.284 m AV Peak Grad:      9.2 mmHg AV Mean Grad:      5.0 mmHg LVOT Vmax:         97.43 cm/s LVOT Vmean:        62.367 cm/s LVOT VTI:          0.183 m LVOT/AV VTI ratio: 0.64  AORTA Ao Root diam: 3.00 cm Ao Asc diam:  2.50 cm MITRAL VALVE MV Area (PHT): 3.99 cm    SHUNTS MV Decel Time: 190 msec    Systemic VTI:  0.18 m MV E velocity: 59.10 cm/s  Systemic Diam: 1.90 cm MV Flynt Breeze velocity: 67.30 cm/s MV E/Chariah Bailey ratio:  0.88 Dalton McleanMD Electronically signed by Franki Monte Signature Date/Time: 05/01/2022/9:37:44 AM    Final    CARDIAC CATHETERIZATION  Result Date: 04/30/2022   Dist LAD lesion is 60% stenosed.   LPAV lesion is 85% stenosed.   Dist RCA lesion is 70% stenosed. 1.  Disease of the distal LAD, distal  circumflex, and distal right coronary artery.  The right coronary artery is severely tortuous.  Given the lack of high-grade proximal obstructive disease PCI was deferred. 2.  Ventriculography and left heart catheterization demonstrated  preserved ejection fraction and LVEDP of 17 mmHg. Recommendation: Medical therapy.   DG CHEST PORT 1 VIEW  Result Date: 04/30/2022 CLINICAL DATA:  Chest pain and cough EXAM: PORTABLE CHEST 1 VIEW COMPARISON:  04/29/2022 FINDINGS: Cardiac shadow is within normal limits. The lungs are well aerated without focal infiltrate. Mild central vascular congestion is seen. No bony abnormality is noted. Postsurgical changes in the cervical spine are seen. IMPRESSION: Mild central vascular congestion without edema. Electronically Signed   By: Inez Catalina M.D.   On: 04/30/2022 02:31    Microbiology: No results found for this or any previous visit (from the past 240 hour(s)).   Labs: Basic Metabolic Panel: Recent Labs  Lab 04/30/22 0601 05/01/22 0052  NA 136 135  K 3.2* 3.8  CL 100 103  CO2 28 24  GLUCOSE 139* 126*  BUN 9 14  CREATININE 0.76 0.69  CALCIUM 8.6* 8.9  MG  --  2.4  PHOS  --  2.8   Liver Function Tests: Recent Labs  Lab 04/30/22 0601  AST 24  ALT 16  ALKPHOS 83  BILITOT 0.4  PROT 6.3*  ALBUMIN 3.5   No results for input(s): "LIPASE", "AMYLASE" in the last 168 hours. No results for input(s): "AMMONIA" in the last 168 hours. CBC: Recent Labs  Lab 04/30/22 0601 05/01/22 0052  WBC 26.9* 27.7*  HGB 12.0 12.9  HCT 35.6* 38.6  MCV 93.4 93.5  PLT 412* 449*   Cardiac Enzymes: No results for input(s): "CKTOTAL", "CKMB", "CKMBINDEX", "TROPONINI" in the last 168 hours. BNP: BNP (last 3 results) Recent Labs    04/30/22 0601  BNP 299.8*    ProBNP (last 3 results) No results for input(s): "PROBNP" in the last 8760 hours.  CBG: No results for input(s): "GLUCAP" in the last 168 hours.     Signed:  Fayrene Helper MD.  [facility] 05/01/2022, 11:11 AM

## 2022-05-03 LAB — LIPOPROTEIN A (LPA): Lipoprotein (a): 70.1 nmol/L — ABNORMAL HIGH (ref <75.0)

## 2022-05-06 ENCOUNTER — Encounter: Payer: Self-pay | Admitting: Student

## 2022-05-06 ENCOUNTER — Ambulatory Visit: Payer: 59 | Attending: Student | Admitting: Student

## 2022-05-06 VITALS — BP 118/74 | HR 87 | Ht 63.0 in | Wt 144.8 lb

## 2022-05-06 DIAGNOSIS — J449 Chronic obstructive pulmonary disease, unspecified: Secondary | ICD-10-CM

## 2022-05-06 DIAGNOSIS — Z86718 Personal history of other venous thrombosis and embolism: Secondary | ICD-10-CM

## 2022-05-06 DIAGNOSIS — I10 Essential (primary) hypertension: Secondary | ICD-10-CM

## 2022-05-06 DIAGNOSIS — E785 Hyperlipidemia, unspecified: Secondary | ICD-10-CM | POA: Diagnosis not present

## 2022-05-06 DIAGNOSIS — I2510 Atherosclerotic heart disease of native coronary artery without angina pectoris: Secondary | ICD-10-CM

## 2022-05-06 MED ORDER — ATORVASTATIN CALCIUM 40 MG PO TABS: 40.0000 mg | ORAL_TABLET | Freq: Every day | ORAL | 3 refills | Status: DC

## 2022-05-06 NOTE — Patient Instructions (Signed)
Medication Instructions:  Your physician has recommended you make the following change in your medication:   Decrease Atorvastatin to 40 mg Daily   *If you need a refill on your cardiac medications before your next appointment, please call your pharmacy*   Lab Work: Your physician recommends that you return for lab work in: 2 months (Fasting )   If you have labs (blood work) drawn today and your tests are completely normal, you will receive your results only by: [place] (if you have MyChart) OR A paper copy in the mail If you have any lab test that is abnormal or we need to change your treatment, we will call you to review the results.   Testing/Procedures: NONE    Follow-Up: At [facility], you and your health needs are our priority.  As part of our continuing mission to provide you with exceptional heart care, we have created designated Provider Care Teams.  These Care Teams include your primary Cardiologist (physician) and Advanced Practice Providers (APPs -  Physician Assistants and Nurse Practitioners) who all work together to provide you with the care you need, when you need it.  We recommend signing up for the patient portal called "MyChart".  Sign up information is provided on this After Visit Summary.  MyChart is used to connect with patients for Virtual Visits (Telemedicine).  Patients are able to view lab/test results, encounter notes, upcoming appointments, etc.  Non-urgent messages can be sent to your provider as well.   To learn more about what you can do with MyChart, go to [email or web address].    Your next appointment:   4 -5 month(s)  Provider:   You may see Carlyle Dolly, MD or one of the following Advanced Practice Providers on your designated Care Team:   Bernerd Pho, PA-C  Ermalinda Barrios, [place]     Other Instructions Thank you for choosing [facility]!
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
## 2022-05-07 DIAGNOSIS — I10 Essential (primary) hypertension: Secondary | ICD-10-CM | POA: Diagnosis not present

## 2022-05-07 DIAGNOSIS — Z299 Encounter for prophylactic measures, unspecified: Secondary | ICD-10-CM | POA: Diagnosis not present

## 2022-05-07 DIAGNOSIS — I509 Heart failure, unspecified: Secondary | ICD-10-CM | POA: Diagnosis not present

## 2022-05-07 DIAGNOSIS — J441 Chronic obstructive pulmonary disease with (acute) exacerbation: Secondary | ICD-10-CM | POA: Diagnosis not present

## 2022-05-07 DIAGNOSIS — I25118 Atherosclerotic heart disease of native coronary artery with other forms of angina pectoris: Secondary | ICD-10-CM | POA: Diagnosis not present

## 2022-05-11 ENCOUNTER — Other Ambulatory Visit (HOSPITAL_COMMUNITY): Payer: Self-pay | Admitting: Psychiatry

## 2022-05-11 DIAGNOSIS — J441 Chronic obstructive pulmonary disease with (acute) exacerbation: Secondary | ICD-10-CM | POA: Diagnosis not present

## 2022-05-11 MED ORDER — QUETIAPINE FUMARATE 200 MG PO TABS: 200.0000 mg | ORAL_TABLET | Freq: Every day | ORAL | 2 refills | Status: DC

## 2022-05-11 MED ORDER — VORTIOXETINE HBR 20 MG PO TABS: 20.0000 mg | ORAL_TABLET | Freq: Every day | ORAL | 2 refills | Status: DC

## 2022-05-11 MED ORDER — CLONAZEPAM 0.5 MG PO TABS: ORAL_TABLET | ORAL | 2 refills | Status: DC

## 2022-05-21 DIAGNOSIS — Z7189 Other specified counseling: Secondary | ICD-10-CM | POA: Diagnosis not present

## 2022-05-21 DIAGNOSIS — J441 Chronic obstructive pulmonary disease with (acute) exacerbation: Secondary | ICD-10-CM | POA: Diagnosis not present

## 2022-05-21 DIAGNOSIS — Z0000 Encounter for general adult medical examination without abnormal findings: Secondary | ICD-10-CM | POA: Diagnosis not present

## 2022-05-21 DIAGNOSIS — I25118 Atherosclerotic heart disease of native coronary artery with other forms of angina pectoris: Secondary | ICD-10-CM | POA: Diagnosis not present

## 2022-05-21 DIAGNOSIS — I10 Essential (primary) hypertension: Secondary | ICD-10-CM | POA: Diagnosis not present

## 2022-05-21 DIAGNOSIS — Z299 Encounter for prophylactic measures, unspecified: Secondary | ICD-10-CM | POA: Diagnosis not present

## 2022-05-23 DIAGNOSIS — I10 Essential (primary) hypertension: Secondary | ICD-10-CM | POA: Diagnosis not present

## 2022-05-23 DIAGNOSIS — I82401 Acute embolism and thrombosis of unspecified deep veins of right lower extremity: Secondary | ICD-10-CM | POA: Diagnosis not present

## 2022-05-23 DIAGNOSIS — Z7902 Long term (current) use of antithrombotics/antiplatelets: Secondary | ICD-10-CM | POA: Diagnosis not present

## 2022-05-23 DIAGNOSIS — R079 Chest pain, unspecified: Secondary | ICD-10-CM | POA: Diagnosis not present

## 2022-05-23 DIAGNOSIS — Z86718 Personal history of other venous thrombosis and embolism: Secondary | ICD-10-CM | POA: Diagnosis not present

## 2022-05-23 DIAGNOSIS — I5032 Chronic diastolic (congestive) heart failure: Secondary | ICD-10-CM | POA: Diagnosis not present

## 2022-05-23 DIAGNOSIS — R0789 Other chest pain: Secondary | ICD-10-CM | POA: Diagnosis not present

## 2022-05-23 DIAGNOSIS — Z79899 Other long term (current) drug therapy: Secondary | ICD-10-CM | POA: Diagnosis not present

## 2022-05-23 DIAGNOSIS — F17210 Nicotine dependence, cigarettes, uncomplicated: Secondary | ICD-10-CM | POA: Diagnosis not present

## 2022-05-23 DIAGNOSIS — Z885 Allergy status to narcotic agent status: Secondary | ICD-10-CM | POA: Diagnosis not present

## 2022-05-23 DIAGNOSIS — M797 Fibromyalgia: Secondary | ICD-10-CM | POA: Diagnosis not present

## 2022-05-23 DIAGNOSIS — R062 Wheezing: Secondary | ICD-10-CM | POA: Diagnosis not present

## 2022-05-23 DIAGNOSIS — I25119 Atherosclerotic heart disease of native coronary artery with unspecified angina pectoris: Secondary | ICD-10-CM | POA: Diagnosis not present

## 2022-05-23 DIAGNOSIS — K219 Gastro-esophageal reflux disease without esophagitis: Secondary | ICD-10-CM | POA: Diagnosis not present

## 2022-05-23 DIAGNOSIS — Z8672 Personal history of thrombophlebitis: Secondary | ICD-10-CM | POA: Diagnosis not present

## 2022-05-23 DIAGNOSIS — Z8679 Personal history of other diseases of the circulatory system: Secondary | ICD-10-CM | POA: Diagnosis not present

## 2022-05-23 DIAGNOSIS — I2510 Atherosclerotic heart disease of native coronary artery without angina pectoris: Secondary | ICD-10-CM | POA: Diagnosis not present

## 2022-05-23 DIAGNOSIS — I499 Cardiac arrhythmia, unspecified: Secondary | ICD-10-CM | POA: Diagnosis not present

## 2022-05-23 DIAGNOSIS — Z91041 Radiographic dye allergy status: Secondary | ICD-10-CM | POA: Diagnosis not present

## 2022-05-23 DIAGNOSIS — Z743 Need for continuous supervision: Secondary | ICD-10-CM | POA: Diagnosis not present

## 2022-05-23 DIAGNOSIS — E785 Hyperlipidemia, unspecified: Secondary | ICD-10-CM | POA: Diagnosis not present

## 2022-05-23 DIAGNOSIS — Z9981 Dependence on supplemental oxygen: Secondary | ICD-10-CM | POA: Diagnosis not present

## 2022-05-23 DIAGNOSIS — I110 Hypertensive heart disease with heart failure: Secondary | ICD-10-CM | POA: Diagnosis not present

## 2022-05-23 DIAGNOSIS — J9602 Acute respiratory failure with hypercapnia: Secondary | ICD-10-CM | POA: Diagnosis not present

## 2022-05-23 DIAGNOSIS — I422 Other hypertrophic cardiomyopathy: Secondary | ICD-10-CM | POA: Diagnosis not present

## 2022-05-23 DIAGNOSIS — Z1152 Encounter for screening for COVID-19: Secondary | ICD-10-CM | POA: Diagnosis not present

## 2022-05-23 DIAGNOSIS — Z8719 Personal history of other diseases of the digestive system: Secondary | ICD-10-CM | POA: Diagnosis not present

## 2022-05-23 DIAGNOSIS — R059 Cough, unspecified: Secondary | ICD-10-CM | POA: Diagnosis not present

## 2022-05-23 DIAGNOSIS — Z20822 Contact with and (suspected) exposure to covid-19: Secondary | ICD-10-CM | POA: Diagnosis not present

## 2022-05-23 DIAGNOSIS — R109 Unspecified abdominal pain: Secondary | ICD-10-CM | POA: Diagnosis not present

## 2022-05-23 DIAGNOSIS — J441 Chronic obstructive pulmonary disease with (acute) exacerbation: Secondary | ICD-10-CM | POA: Diagnosis not present

## 2022-05-23 DIAGNOSIS — R0689 Other abnormalities of breathing: Secondary | ICD-10-CM | POA: Diagnosis not present

## 2022-05-23 DIAGNOSIS — E876 Hypokalemia: Secondary | ICD-10-CM | POA: Diagnosis not present

## 2022-05-23 DIAGNOSIS — J449 Chronic obstructive pulmonary disease, unspecified: Secondary | ICD-10-CM | POA: Diagnosis not present

## 2022-05-23 DIAGNOSIS — M1990 Unspecified osteoarthritis, unspecified site: Secondary | ICD-10-CM | POA: Diagnosis not present

## 2022-05-23 DIAGNOSIS — G629 Polyneuropathy, unspecified: Secondary | ICD-10-CM | POA: Diagnosis not present

## 2022-05-23 DIAGNOSIS — Z7901 Long term (current) use of anticoagulants: Secondary | ICD-10-CM | POA: Diagnosis not present

## 2022-05-23 DIAGNOSIS — R198 Other specified symptoms and signs involving the digestive system and abdomen: Secondary | ICD-10-CM | POA: Diagnosis not present

## 2022-05-23 DIAGNOSIS — R0902 Hypoxemia: Secondary | ICD-10-CM | POA: Diagnosis not present

## 2022-05-23 DIAGNOSIS — Z888 Allergy status to other drugs, medicaments and biological substances status: Secondary | ICD-10-CM | POA: Diagnosis not present

## 2022-05-23 DIAGNOSIS — F32A Depression, unspecified: Secondary | ICD-10-CM | POA: Diagnosis not present

## 2022-05-23 DIAGNOSIS — R0602 Shortness of breath: Secondary | ICD-10-CM | POA: Diagnosis not present

## 2022-05-23 DIAGNOSIS — Z720 Tobacco use: Secondary | ICD-10-CM | POA: Diagnosis not present

## 2022-05-24 DIAGNOSIS — R0902 Hypoxemia: Secondary | ICD-10-CM | POA: Insufficient documentation
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
## 2022-06-09 ENCOUNTER — Ambulatory Visit (INDEPENDENT_AMBULATORY_CARE_PROVIDER_SITE_OTHER): Payer: 59 | Admitting: Internal Medicine

## 2022-06-09 ENCOUNTER — Encounter: Payer: Self-pay | Admitting: Internal Medicine

## 2022-06-09 VITALS — BP 115/76 | HR 91 | Ht 63.0 in | Wt 157.0 lb

## 2022-06-09 DIAGNOSIS — R0609 Other forms of dyspnea: Secondary | ICD-10-CM | POA: Diagnosis not present

## 2022-06-09 DIAGNOSIS — J9611 Chronic respiratory failure with hypoxia: Secondary | ICD-10-CM | POA: Insufficient documentation

## 2022-06-09 DIAGNOSIS — I10 Essential (primary) hypertension: Secondary | ICD-10-CM

## 2022-06-09 DIAGNOSIS — F17210 Nicotine dependence, cigarettes, uncomplicated: Secondary | ICD-10-CM | POA: Insufficient documentation

## 2022-06-09 DIAGNOSIS — Z87891 Personal history of nicotine dependence: Secondary | ICD-10-CM

## 2022-06-09 MED ORDER — OLMESARTAN MEDOXOMIL 40 MG PO TABS: 40.0000 mg | ORAL_TABLET | Freq: Every day | ORAL | 11 refills | Status: DC

## 2022-06-09 MED ORDER — ALBUTEROL SULFATE (2.5 MG/3ML) 0.083% IN NEBU: 2.5000 mg | INHALATION_SOLUTION | RESPIRATORY_TRACT | Status: AC | PRN

## 2022-06-09 NOTE — Assessment & Plan Note (Signed)
Quit smoking 05/2022 at admit for copd exac -  06/09/2022  After extensive coaching inhaler device,  effectiveness =    75% from a baseline on 25% > continue breztri for now pending pfts   DDX of  difficult airways management almost all start with A and  include Adherence, Ace Inhibitors, Acid Reflux, Active Sinus Disease, Alpha 1 Antitripsin deficiency, Anxiety masquerading as Airways dz,  ABPA,  Allergy(esp in young), Aspiration (esp in elderly), Adverse effects of meds,  Active smoking or vaping, A bunch of PE's (a small clot burden can't cause this syndrome unless there is already severe underlying pulm or vascular dz with poor reserve) plus two Bs  = Bronchiectasis and Beta blocker use..and one C= CHF  Adherence is always the initial "prime suspect" and is a multilayered concern that requires a "trust but verify" approach in every patient - starting with knowing how to use medications, especially inhalers, correctly, keeping up with refills and understanding the fundamental difference between maintenance and prns vs those medications only taken for a very short course and then stopped and not refilled.  -see hfa teaching - return with all meds in hand using a trust but verify approach to confirm accurate Medication  Reconciliation The principal here is that until we are certain that the  patients are doing what we've asked, it makes no sense to ask them to do more.   Active smoking > denies urge to smoke, reinforced impt of maint abstinence  ACEi adverse effects at the  top of the usual list of suspects and the only way to rule it out is a trial off > see hbp a/p  ? Adverse effects of dpi > leave off spiriva dpi, esp since no on lama per breztri   ? A bunch of PEs > unlikely on eliquis   ? BB > unlikely on such low doses of lopressor   ? Chf / cardiac asthma : echo 05/01/22 with G 1 diastolic dysfunction and mild LAE/ nl R side   >>> f/u in 4 weeks, sooner prn

## 2022-06-09 NOTE — Patient Instructions (Addendum)
Plan A = Automatic = Always=    Breztri Take 2 puffs first thing in am and then another 2 puffs about 12 hours later.    Work on inhaler technique:  relax and gently blow all the way out then take a nice smooth full deep breath back in, triggering the inhaler at same time you start breathing in.  Hold breath in for at least  5 seconds if you can. Blow out breztri  thru nose. Rinse and gargle with water when done.  If mouth or throat bother you at all,  try brushing teeth/gums/tongue with arm and hammer toothpaste/ make a slurry and gargle and spit out.  - remember how golfers warm up  by taking practice swings at air      Plan B = Backup (to supplement plan A, not to replace it) Only use your albuterol inhaler as a rescue medication to be used if you can't catch your breath by resting or doing a relaxed purse lip breathing pattern.  - The less you use it, the better it will work when you need it. - Ok to use the inhaler up to 2 puffs  every 4 hours if you must but call for appointment if use goes up over your usual need - Don't leave home without it !!  (think of it like the spare tire for your car)   Plan C = Crisis (instead of Plan B but only if Plan B stops working) - only use your albuterol nebulizer if you first try Plan B and it fails to help > ok to use the nebulizer up to every 4 hours but if start needing it regularly call for immediate appointment  Stop lisinopril   Start olmesartan 40 mg one daily in place of lisinopril  My office will be contacting you by phone for referral to lung cancer screening   - if you don't hear back from my office within one week please call us back or notify us thru MyChart and we'll address it right away.   Please remember to go to the lab department   for your tests - we will call you with the results when they are available.      Please schedule a follow up office visit in 4 weeks, sooner if needed  with all medications /inhalers/ solutions in hand  so we can verify exactly what you are taking. This includes all medications from all doctors and over the counters

## 2022-06-09 NOTE — Assessment & Plan Note (Signed)
Placed on 02  2lpm at d/c 05/01/22 - sats at rest 94% 06/09/2022 and walked slow pace with cane x 300 ft, stopped due to sob with sats still 90%   >>>Rec 2lpm hs and daytime titrate to keep sats > 90%  >>> Bring portable 02 to next ov if needing it at all   Each maintenance medication was reviewed in detail including emphasizing most importantly the difference between maintenance and prns and under what circumstances the prns are to be triggered using an action plan format where appropriate.  Total time for H and P, chart review, counseling, reviewing hfa/neb/02 device(s) , directly observing portions of ambulatory 02 saturation study/ and generating customized AVS unique to this office visit / same day charting = 47 min with pt new to me

## 2022-06-09 NOTE — Assessment & Plan Note (Signed)
Changed to ARB 06/09/2022 due to cough   ACE inhibitors are problematic in  pts with airway complaints because  even experienced pulmonologists can't always distinguish ace effects from copd/asthma.  By themselves they don't actually cause a problem, much like oxygen can't by itself start a fire, but they certainly serve as a powerful catalyst or enhancer for any "fire"  or inflammatory process in the upper airway, be it caused by an ET  tube or more commonly reflux (especially in the obese or pts with known GERD or who are on biphoshonates).    In the era of ARB near equivalency until we have a better handle on the reversibility of the airway problem, it just makes sense to avoid ACEI  entirely in the short run and then decide later, having established a level of airway control using a reasonable limited regimen, whether to add back ace but even then being very careful to observe the pt for worsening airway control and number of meds used/ needed to control symptoms.

## 2022-06-09 NOTE — Assessment & Plan Note (Signed)
Referred for LDSCT  06/09/2022 >>>   Low-dose CT lung cancer screening is recommended for patients who are 36-63 years of age with a 20+ pack-year history of smoking and who are currently smoking or quit <=15 years ago. No coughing up blood  No unintentional weight loss of > 15 pounds in the last 6 months - pt is eligible for scanning yearly until 2039 > referred for shared decision making

## 2022-06-10 LAB — BASIC METABOLIC PANEL
BUN/Creatinine Ratio: 11 — ABNORMAL LOW (ref 12–28)
BUN: 11 mg/dL (ref 8–27)
CO2: 22 mmol/L (ref 20–29)
Calcium: 9.7 mg/dL (ref 8.7–10.3)
Chloride: 107 mmol/L — ABNORMAL HIGH (ref 96–106)
Creatinine, Ser: 1.04 mg/dL — ABNORMAL HIGH (ref 0.57–1.00)
Glucose: 96 mg/dL (ref 70–99)
Potassium: 4.3 mmol/L (ref 3.5–5.2)
Sodium: 145 mmol/L — ABNORMAL HIGH (ref 134–144)
eGFR: 61 mL/min/{1.73_m2} (ref >59)

## 2022-06-10 LAB — CBC WITH DIFFERENTIAL/PLATELET
Basophils Absolute: 0.1 10*3/uL (ref 0.0–0.2)
Basos: 1 %
EOS (ABSOLUTE): 1.7 10*3/uL — ABNORMAL HIGH (ref 0.0–0.4)
Eos: 16 %
Hematocrit: 34.6 % (ref 34.0–46.6)
Hemoglobin: 11.2 g/dL (ref 11.1–15.9)
Immature Grans (Abs): 0.1 10*3/uL (ref 0.0–0.1)
Immature Granulocytes: 1 %
Lymphocytes Absolute: 2 10*3/uL (ref 0.7–3.1)
Lymphs: 19 %
MCH: 31.5 pg (ref 26.6–33.0)
MCHC: 32.4 g/dL (ref 31.5–35.7)
MCV: 97 fL (ref 79–97)
Monocytes Absolute: 1.5 10*3/uL — ABNORMAL HIGH (ref 0.1–0.9)
Monocytes: 14 %
Neutrophils Absolute: 5.2 10*3/uL (ref 1.4–7.0)
Neutrophils: 49 %
Platelets: 383 10*3/uL (ref 150–450)
RBC: 3.56 x10E6/uL — ABNORMAL LOW (ref 3.77–5.28)
RDW: 12.5 % (ref 11.7–15.4)
WBC: 10.5 10*3/uL (ref 3.4–10.8)

## 2022-06-10 LAB — TSH: TSH: 1.58 u[IU]/mL (ref 0.450–4.500)

## 2022-06-10 LAB — BRAIN NATRIURETIC PEPTIDE: BNP: 52.4 pg/mL (ref 0.0–100.0)

## 2022-06-11 NOTE — Progress Notes (Signed)
Tried calling the pt and the line was busy, Will call back.

## 2022-06-13 DIAGNOSIS — I499 Cardiac arrhythmia, unspecified: Secondary | ICD-10-CM | POA: Diagnosis not present

## 2022-06-13 DIAGNOSIS — Z7952 Long term (current) use of systemic steroids: Secondary | ICD-10-CM | POA: Diagnosis not present

## 2022-06-13 DIAGNOSIS — Z9981 Dependence on supplemental oxygen: Secondary | ICD-10-CM | POA: Diagnosis not present

## 2022-06-13 DIAGNOSIS — Z7901 Long term (current) use of anticoagulants: Secondary | ICD-10-CM | POA: Diagnosis not present

## 2022-06-13 DIAGNOSIS — J90 Pleural effusion, not elsewhere classified: Secondary | ICD-10-CM | POA: Diagnosis not present

## 2022-06-13 DIAGNOSIS — K219 Gastro-esophageal reflux disease without esophagitis: Secondary | ICD-10-CM | POA: Diagnosis not present

## 2022-06-13 DIAGNOSIS — R071 Chest pain on breathing: Secondary | ICD-10-CM | POA: Diagnosis not present

## 2022-06-13 DIAGNOSIS — Z9989 Dependence on other enabling machines and devices: Secondary | ICD-10-CM | POA: Diagnosis not present

## 2022-06-13 DIAGNOSIS — T300 Burn of unspecified body region, unspecified degree: Secondary | ICD-10-CM | POA: Diagnosis not present

## 2022-06-13 DIAGNOSIS — J441 Chronic obstructive pulmonary disease with (acute) exacerbation: Secondary | ICD-10-CM | POA: Diagnosis not present

## 2022-06-13 DIAGNOSIS — Z7902 Long term (current) use of antithrombotics/antiplatelets: Secondary | ICD-10-CM | POA: Diagnosis not present

## 2022-06-13 DIAGNOSIS — Z5941 Food insecurity: Secondary | ICD-10-CM | POA: Diagnosis not present

## 2022-06-13 DIAGNOSIS — Z720 Tobacco use: Secondary | ICD-10-CM | POA: Diagnosis not present

## 2022-06-13 DIAGNOSIS — E785 Hyperlipidemia, unspecified: Secondary | ICD-10-CM | POA: Diagnosis not present

## 2022-06-13 DIAGNOSIS — F17210 Nicotine dependence, cigarettes, uncomplicated: Secondary | ICD-10-CM | POA: Diagnosis not present

## 2022-06-13 DIAGNOSIS — I82409 Acute embolism and thrombosis of unspecified deep veins of unspecified lower extremity: Secondary | ICD-10-CM | POA: Diagnosis not present

## 2022-06-13 DIAGNOSIS — M1990 Unspecified osteoarthritis, unspecified site: Secondary | ICD-10-CM | POA: Diagnosis not present

## 2022-06-13 DIAGNOSIS — R069 Unspecified abnormalities of breathing: Secondary | ICD-10-CM | POA: Diagnosis not present

## 2022-06-13 DIAGNOSIS — Z20822 Contact with and (suspected) exposure to covid-19: Secondary | ICD-10-CM | POA: Diagnosis not present

## 2022-06-13 DIAGNOSIS — R0600 Dyspnea, unspecified: Secondary | ICD-10-CM | POA: Diagnosis not present

## 2022-06-13 DIAGNOSIS — F32A Depression, unspecified: Secondary | ICD-10-CM | POA: Diagnosis not present

## 2022-06-13 DIAGNOSIS — G629 Polyneuropathy, unspecified: Secondary | ICD-10-CM | POA: Diagnosis not present

## 2022-06-13 DIAGNOSIS — I2510 Atherosclerotic heart disease of native coronary artery without angina pectoris: Secondary | ICD-10-CM | POA: Diagnosis not present

## 2022-06-13 DIAGNOSIS — I501 Left ventricular failure: Secondary | ICD-10-CM | POA: Diagnosis not present

## 2022-06-13 DIAGNOSIS — Z86718 Personal history of other venous thrombosis and embolism: Secondary | ICD-10-CM | POA: Diagnosis not present

## 2022-06-13 DIAGNOSIS — Z792 Long term (current) use of antibiotics: Secondary | ICD-10-CM | POA: Diagnosis not present

## 2022-06-13 DIAGNOSIS — J1569 Pneumonia due to other gram-negative bacteria: Secondary | ICD-10-CM | POA: Diagnosis not present

## 2022-06-13 DIAGNOSIS — I10 Essential (primary) hypertension: Secondary | ICD-10-CM | POA: Diagnosis not present

## 2022-06-13 DIAGNOSIS — R0789 Other chest pain: Secondary | ICD-10-CM | POA: Diagnosis not present

## 2022-06-13 DIAGNOSIS — M797 Fibromyalgia: Secondary | ICD-10-CM | POA: Diagnosis not present

## 2022-06-13 DIAGNOSIS — R0602 Shortness of breath: Secondary | ICD-10-CM | POA: Diagnosis not present

## 2022-06-13 DIAGNOSIS — I119 Hypertensive heart disease without heart failure: Secondary | ICD-10-CM | POA: Diagnosis not present

## 2022-06-13 DIAGNOSIS — Z79899 Other long term (current) drug therapy: Secondary | ICD-10-CM | POA: Diagnosis not present

## 2022-06-13 DIAGNOSIS — Z743 Need for continuous supervision: Secondary | ICD-10-CM | POA: Diagnosis not present

## 2022-06-13 DIAGNOSIS — Z7951 Long term (current) use of inhaled steroids: Secondary | ICD-10-CM | POA: Diagnosis not present

## 2022-06-13 DIAGNOSIS — I517 Cardiomegaly: Secondary | ICD-10-CM | POA: Diagnosis not present

## 2022-06-13 DIAGNOSIS — J9602 Acute respiratory failure with hypercapnia: Secondary | ICD-10-CM | POA: Diagnosis not present

## 2022-06-14 DIAGNOSIS — I2510 Atherosclerotic heart disease of native coronary artery without angina pectoris: Secondary | ICD-10-CM | POA: Insufficient documentation

## 2022-06-17 NOTE — Progress Notes (Signed)
Pt currently admitted to hospital. Will try calling her again after she is d/c.

## 2022-06-18 ENCOUNTER — Ambulatory Visit: Payer: 59 | Admitting: Nurse Practitioner

## 2022-06-21 DIAGNOSIS — J441 Chronic obstructive pulmonary disease with (acute) exacerbation: Secondary | ICD-10-CM | POA: Diagnosis not present

## 2022-06-26 DIAGNOSIS — I10 Essential (primary) hypertension: Secondary | ICD-10-CM | POA: Diagnosis not present

## 2022-06-26 DIAGNOSIS — D473 Essential (hemorrhagic) thrombocythemia: Secondary | ICD-10-CM | POA: Diagnosis not present

## 2022-06-26 DIAGNOSIS — J449 Chronic obstructive pulmonary disease, unspecified: Secondary | ICD-10-CM | POA: Diagnosis not present

## 2022-06-26 DIAGNOSIS — J441 Chronic obstructive pulmonary disease with (acute) exacerbation: Secondary | ICD-10-CM | POA: Diagnosis not present

## 2022-06-26 DIAGNOSIS — Z299 Encounter for prophylactic measures, unspecified: Secondary | ICD-10-CM | POA: Diagnosis not present

## 2022-06-26 DIAGNOSIS — I82409 Acute embolism and thrombosis of unspecified deep veins of unspecified lower extremity: Secondary | ICD-10-CM | POA: Diagnosis not present

## 2022-07-01 ENCOUNTER — Telehealth (INDEPENDENT_AMBULATORY_CARE_PROVIDER_SITE_OTHER): Payer: 59 | Admitting: Psychiatry

## 2022-07-01 ENCOUNTER — Encounter (HOSPITAL_COMMUNITY): Payer: Self-pay | Admitting: Psychiatry

## 2022-07-01 DIAGNOSIS — F331 Major depressive disorder, recurrent, moderate: Secondary | ICD-10-CM

## 2022-07-01 MED ORDER — CLONAZEPAM 0.5 MG PO TABS: ORAL_TABLET | ORAL | 2 refills | Status: DC

## 2022-07-01 MED ORDER — VORTIOXETINE HBR 20 MG PO TABS: 20.0000 mg | ORAL_TABLET | Freq: Every day | ORAL | 2 refills | Status: DC

## 2022-07-01 MED ORDER — QUETIAPINE FUMARATE 200 MG PO TABS: 200.0000 mg | ORAL_TABLET | Freq: Every day | ORAL | 2 refills | Status: DC
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
## 2022-07-14 ENCOUNTER — Other Ambulatory Visit (HOSPITAL_COMMUNITY): Payer: Self-pay | Admitting: Psychiatry
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
## 2022-07-15 ENCOUNTER — Ambulatory Visit (INDEPENDENT_AMBULATORY_CARE_PROVIDER_SITE_OTHER): Payer: 59 | Admitting: Internal Medicine

## 2022-07-15 ENCOUNTER — Other Ambulatory Visit (HOSPITAL_COMMUNITY): Payer: Self-pay | Admitting: Psychiatry

## 2022-07-15 ENCOUNTER — Encounter: Payer: Self-pay | Admitting: Internal Medicine

## 2022-07-15 VITALS — BP 122/83 | HR 97 | Ht 63.0 in | Wt 157.4 lb

## 2022-07-15 DIAGNOSIS — Z87891 Personal history of nicotine dependence: Secondary | ICD-10-CM | POA: Diagnosis not present

## 2022-07-15 DIAGNOSIS — R0609 Other forms of dyspnea: Secondary | ICD-10-CM | POA: Diagnosis not present

## 2022-07-15 NOTE — Assessment & Plan Note (Addendum)
Referred for LDSCT  06/09/2022 >>> again 07/15/2022   Low-dose CT lung cancer screening is recommended for patients who are 53-63 years of age with a 20+ pack-year history of smoking and who are currently smoking or quit <=15 years ago. No coughing up blood  No unintentional weight loss of > 15 pounds in the last 6 months - pt is eligible for scanning yearly until 2039   F/u here q 3 m, sooner prn          Each maintenance medication was reviewed in detail including emphasizing most importantly the difference between maintenance and prns and under what circumstances the prns are to be triggered using an action plan format where appropriate.  Total time for H and P, chart review, counseling,  and generating customized AVS unique to this office visit / same day charting = 25 min

## 2022-07-15 NOTE — Patient Instructions (Addendum)
Make sure you check your oxygen saturation  AT  your highest level of activity (not after you stop)   to be sure it stays over 90% and adjust  02 flow upward to maintain this level if needed but remember to turn it back to previous settings when you stop (to conserve your supply).   Continue Breztri Take 2 puffs first thing in am and then another 2 puffs about 12 hours later.    My office will be contacting you by phone for referral to [facility] for lung scan ([phone])   - if you don't hear back from my office within one week please call us back or notify us thru MyChart and we'll address it right away.    Please schedule a follow up visit in 3 months but call sooner if needed with PFTs prior

## 2022-07-15 NOTE — Assessment & Plan Note (Addendum)
Quit smoking 05/2022 at admit for copd exac -  06/09/2022   continue breztri for now pending pfts - Allergy screen  06/09/22    Eos 1.7   -  07/15/2022  After extensive coaching inhaler device,  effectiveness =    60% from baseline of 30% > continue breztri    Group D (now reclassified as E) in terms of symptom/risk and laba/lama/ICS  therefore appropriate rx at this point >>>  breztri and approp saba pending pfts

## 2022-07-16 ENCOUNTER — Ambulatory Visit: Payer: 59 | Admitting: Nurse Practitioner
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
## 2022-07-21 DIAGNOSIS — J441 Chronic obstructive pulmonary disease with (acute) exacerbation: Secondary | ICD-10-CM | POA: Diagnosis not present

## 2022-07-22 ENCOUNTER — Ambulatory Visit: Payer: 59 | Attending: Nurse Practitioner | Admitting: Nurse Practitioner
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
## 2022-07-27 DIAGNOSIS — J449 Chronic obstructive pulmonary disease, unspecified: Secondary | ICD-10-CM | POA: Diagnosis not present

## 2022-08-07 ENCOUNTER — Telehealth: Payer: Self-pay | Admitting: *Deleted

## 2022-08-07 DIAGNOSIS — J9611 Chronic respiratory failure with hypoxia: Secondary | ICD-10-CM

## 2022-08-07 DIAGNOSIS — R0609 Other forms of dyspnea: Secondary | ICD-10-CM

## 2022-08-07 NOTE — Telephone Encounter (Signed)
Patient called and states that she has a breathalyzer on her vehicle and would like to see if Dr. Sherene Sires would grant a letter stating patient has issues with breathing for the breathalyzer to be shorter for her to file in the court system.  Please call and advise patient.

## 2022-08-10 ENCOUNTER — Other Ambulatory Visit (HOSPITAL_COMMUNITY): Payer: Self-pay | Admitting: Psychiatry

## 2022-08-10 NOTE — Telephone Encounter (Signed)
ATC pt LVM for her to call office back.  

## 2022-08-11 ENCOUNTER — Other Ambulatory Visit (HOSPITAL_COMMUNITY): Payer: Self-pay | Admitting: Psychiatry

## 2022-08-17 NOTE — Telephone Encounter (Signed)
Yes order pf

## 2022-08-17 NOTE — Telephone Encounter (Signed)
Spoke with the pt  She states she is needing letter stating that she is unable to blow into her ignition interlock device  She has not had PFT Can we order one at [facility]?

## 2022-08-21 DIAGNOSIS — J441 Chronic obstructive pulmonary disease with (acute) exacerbation: Secondary | ICD-10-CM | POA: Diagnosis not present

## 2022-08-21 NOTE — Telephone Encounter (Signed)
LVM w/ pt let her know that I have placed order for her to have a PFT @ Hayley Juarez next available.

## 2022-08-26 DIAGNOSIS — J449 Chronic obstructive pulmonary disease, unspecified: Secondary | ICD-10-CM | POA: Diagnosis not present

## 2022-09-09 DIAGNOSIS — R0902 Hypoxemia: Secondary | ICD-10-CM | POA: Diagnosis not present

## 2022-09-09 DIAGNOSIS — Z794 Long term (current) use of insulin: Secondary | ICD-10-CM | POA: Diagnosis not present

## 2022-09-09 DIAGNOSIS — I2489 Other forms of acute ischemic heart disease: Secondary | ICD-10-CM | POA: Diagnosis not present

## 2022-09-09 DIAGNOSIS — R778 Other specified abnormalities of plasma proteins: Secondary | ICD-10-CM | POA: Diagnosis not present

## 2022-09-09 DIAGNOSIS — R7989 Other specified abnormal findings of blood chemistry: Secondary | ICD-10-CM | POA: Diagnosis not present

## 2022-09-09 DIAGNOSIS — Z91041 Radiographic dye allergy status: Secondary | ICD-10-CM | POA: Diagnosis not present

## 2022-09-09 DIAGNOSIS — K219 Gastro-esophageal reflux disease without esophagitis: Secondary | ICD-10-CM | POA: Diagnosis not present

## 2022-09-09 DIAGNOSIS — M797 Fibromyalgia: Secondary | ICD-10-CM | POA: Diagnosis not present

## 2022-09-09 DIAGNOSIS — Z792 Long term (current) use of antibiotics: Secondary | ICD-10-CM | POA: Diagnosis not present

## 2022-09-09 DIAGNOSIS — E119 Type 2 diabetes mellitus without complications: Secondary | ICD-10-CM | POA: Diagnosis not present

## 2022-09-09 DIAGNOSIS — Z7951 Long term (current) use of inhaled steroids: Secondary | ICD-10-CM | POA: Diagnosis not present

## 2022-09-09 DIAGNOSIS — I110 Hypertensive heart disease with heart failure: Secondary | ICD-10-CM | POA: Diagnosis not present

## 2022-09-09 DIAGNOSIS — I2510 Atherosclerotic heart disease of native coronary artery without angina pectoris: Secondary | ICD-10-CM | POA: Diagnosis not present

## 2022-09-09 DIAGNOSIS — J9601 Acute respiratory failure with hypoxia: Secondary | ICD-10-CM | POA: Diagnosis not present

## 2022-09-09 DIAGNOSIS — I10 Essential (primary) hypertension: Secondary | ICD-10-CM | POA: Diagnosis not present

## 2022-09-09 DIAGNOSIS — R059 Cough, unspecified: Secondary | ICD-10-CM | POA: Diagnosis not present

## 2022-09-09 DIAGNOSIS — R079 Chest pain, unspecified: Secondary | ICD-10-CM | POA: Diagnosis not present

## 2022-09-09 DIAGNOSIS — E1142 Type 2 diabetes mellitus with diabetic polyneuropathy: Secondary | ICD-10-CM | POA: Diagnosis not present

## 2022-09-09 DIAGNOSIS — Z79899 Other long term (current) drug therapy: Secondary | ICD-10-CM | POA: Diagnosis not present

## 2022-09-09 DIAGNOSIS — Z885 Allergy status to narcotic agent status: Secondary | ICD-10-CM | POA: Diagnosis not present

## 2022-09-09 DIAGNOSIS — D72829 Elevated white blood cell count, unspecified: Secondary | ICD-10-CM | POA: Diagnosis not present

## 2022-09-09 DIAGNOSIS — I499 Cardiac arrhythmia, unspecified: Secondary | ICD-10-CM | POA: Diagnosis not present

## 2022-09-09 DIAGNOSIS — Z9981 Dependence on supplemental oxygen: Secondary | ICD-10-CM | POA: Diagnosis not present

## 2022-09-09 DIAGNOSIS — Z888 Allergy status to other drugs, medicaments and biological substances status: Secondary | ICD-10-CM | POA: Diagnosis not present

## 2022-09-09 DIAGNOSIS — I771 Stricture of artery: Secondary | ICD-10-CM | POA: Diagnosis not present

## 2022-09-09 DIAGNOSIS — E785 Hyperlipidemia, unspecified: Secondary | ICD-10-CM | POA: Diagnosis not present

## 2022-09-09 DIAGNOSIS — Z86718 Personal history of other venous thrombosis and embolism: Secondary | ICD-10-CM | POA: Diagnosis not present

## 2022-09-09 DIAGNOSIS — J9621 Acute and chronic respiratory failure with hypoxia: Secondary | ICD-10-CM | POA: Diagnosis not present

## 2022-09-09 DIAGNOSIS — Z87891 Personal history of nicotine dependence: Secondary | ICD-10-CM | POA: Diagnosis not present

## 2022-09-09 DIAGNOSIS — Z743 Need for continuous supervision: Secondary | ICD-10-CM | POA: Diagnosis not present

## 2022-09-09 DIAGNOSIS — Z7901 Long term (current) use of anticoagulants: Secondary | ICD-10-CM | POA: Diagnosis not present

## 2022-09-09 DIAGNOSIS — F17200 Nicotine dependence, unspecified, uncomplicated: Secondary | ICD-10-CM | POA: Diagnosis not present

## 2022-09-09 DIAGNOSIS — I252 Old myocardial infarction: Secondary | ICD-10-CM | POA: Diagnosis not present

## 2022-09-09 DIAGNOSIS — Z5941 Food insecurity: Secondary | ICD-10-CM | POA: Diagnosis not present

## 2022-09-09 DIAGNOSIS — R0989 Other specified symptoms and signs involving the circulatory and respiratory systems: Secondary | ICD-10-CM | POA: Diagnosis not present

## 2022-09-09 DIAGNOSIS — R0602 Shortness of breath: Secondary | ICD-10-CM | POA: Diagnosis not present

## 2022-09-09 DIAGNOSIS — J9611 Chronic respiratory failure with hypoxia: Secondary | ICD-10-CM | POA: Diagnosis not present

## 2022-09-09 DIAGNOSIS — Z1152 Encounter for screening for COVID-19: Secondary | ICD-10-CM | POA: Diagnosis not present

## 2022-09-09 DIAGNOSIS — Z7902 Long term (current) use of antithrombotics/antiplatelets: Secondary | ICD-10-CM | POA: Diagnosis not present

## 2022-09-09 DIAGNOSIS — Z20822 Contact with and (suspected) exposure to covid-19: Secondary | ICD-10-CM | POA: Diagnosis not present

## 2022-09-09 DIAGNOSIS — I509 Heart failure, unspecified: Secondary | ICD-10-CM | POA: Diagnosis not present

## 2022-09-09 DIAGNOSIS — Z7952 Long term (current) use of systemic steroids: Secondary | ICD-10-CM | POA: Diagnosis not present

## 2022-09-09 DIAGNOSIS — M1990 Unspecified osteoarthritis, unspecified site: Secondary | ICD-10-CM | POA: Diagnosis not present

## 2022-09-09 DIAGNOSIS — J441 Chronic obstructive pulmonary disease with (acute) exacerbation: Secondary | ICD-10-CM | POA: Diagnosis not present

## 2022-09-17 DIAGNOSIS — J441 Chronic obstructive pulmonary disease with (acute) exacerbation: Secondary | ICD-10-CM | POA: Diagnosis not present

## 2022-09-17 DIAGNOSIS — J9611 Chronic respiratory failure with hypoxia: Secondary | ICD-10-CM | POA: Diagnosis not present

## 2022-09-17 DIAGNOSIS — Z299 Encounter for prophylactic measures, unspecified: Secondary | ICD-10-CM | POA: Diagnosis not present

## 2022-09-17 DIAGNOSIS — I10 Essential (primary) hypertension: Secondary | ICD-10-CM | POA: Diagnosis not present

## 2022-09-17 DIAGNOSIS — I509 Heart failure, unspecified: Secondary | ICD-10-CM | POA: Diagnosis not present

## 2022-09-20 DIAGNOSIS — J441 Chronic obstructive pulmonary disease with (acute) exacerbation: Secondary | ICD-10-CM | POA: Diagnosis not present

## 2022-09-26 DIAGNOSIS — J449 Chronic obstructive pulmonary disease, unspecified: Secondary | ICD-10-CM | POA: Diagnosis not present

## 2022-10-01 ENCOUNTER — Encounter (HOSPITAL_COMMUNITY): Payer: Self-pay | Admitting: Psychiatry

## 2022-10-01 ENCOUNTER — Telehealth (INDEPENDENT_AMBULATORY_CARE_PROVIDER_SITE_OTHER): Payer: 59 | Admitting: Psychiatry

## 2022-10-01 DIAGNOSIS — F331 Major depressive disorder, recurrent, moderate: Secondary | ICD-10-CM

## 2022-10-01 MED ORDER — VORTIOXETINE HBR 20 MG PO TABS: 20.0000 mg | ORAL_TABLET | Freq: Every day | ORAL | 2 refills | Status: DC

## 2022-10-01 MED ORDER — CLONAZEPAM 0.5 MG PO TABS: ORAL_TABLET | ORAL | 2 refills | Status: DC

## 2022-10-01 MED ORDER — QUETIAPINE FUMARATE 200 MG PO TABS: 200.0000 mg | ORAL_TABLET | Freq: Every day | ORAL | 2 refills | Status: DC
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
## 2022-10-09 ENCOUNTER — Other Ambulatory Visit (HOSPITAL_COMMUNITY): Payer: Self-pay | Admitting: Psychiatry
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
## 2022-10-13 ENCOUNTER — Ambulatory Visit (INDEPENDENT_AMBULATORY_CARE_PROVIDER_SITE_OTHER): Payer: 59 | Admitting: Internal Medicine

## 2022-10-13 ENCOUNTER — Encounter: Payer: Self-pay | Admitting: Internal Medicine

## 2022-10-13 VITALS — BP 126/83 | HR 105 | Ht 63.0 in | Wt 161.0 lb

## 2022-10-13 DIAGNOSIS — R0609 Other forms of dyspnea: Secondary | ICD-10-CM

## 2022-10-13 MED ORDER — BUDESONIDE 0.25 MG/2ML IN SUSP: RESPIRATORY_TRACT | 12 refills | Status: DC

## 2022-10-13 NOTE — Patient Instructions (Addendum)
Duoneb (ipatropium/albuterol) nebulizer with budesonide 0.25 mg twice daily   Only use your albuterol as a rescue medication to be used if you can't catch your breath by resting or doing a relaxed purse lip breathing pattern.  - The less you use it, the better it will work when you need it. - Ok to use up to 2 puffs  every 4 hours if you must but call for immediate appointment if use goes up over your usual need - Don't leave home without it !!  (think of it like the spare tire for your car)   Work on inhaler technique:  relax and gently blow all the way out then take a nice smooth full deep breath back in, triggering the inhaler at same time you start breathing in.  Hold breath in for at least  5 seconds if you can. . Rinse and gargle with water when done.  If mouth or throat bother you at all,  try brushing teeth/gums/tongue with arm and hammer toothpaste/ make a slurry and gargle and spit out.   If not better use albuterol nebulizer as your back up every 4 hours if needed    Please schedule a follow up visit in 3 months but call sooner if needed

## 2022-10-13 NOTE — Assessment & Plan Note (Addendum)
Quit smoking 05/2022 at admit for "copd exac" -  06/09/2022   continue breztri for now pending pfts - Allergy screen  06/09/22    Eos 1.7   -  07/15/2022  After extensive coaching inhaler device,  effectiveness =    60% from baseline of 30% > continue breztri > insurance did not cover and no change off sample as of 10/13/2022 > try duoneb / bud 0.25 mg bid (due to elevated Eos)  - 10/13/2022   Walked on RA  x  3  lap(s) =  approx 450  ft  @ nl  pace, stopped due to end of study  with lowest 02 sats 98%   Pt is probably a GOLD group A and needs just prn saba   Try duoneb/budesonide 0.25 bid pending return for pfts  - The proper method of use, as well as anticipated side effects, of a metered-dose inhaler were discussed and demonstrated to the patient using teach back method.    Each maintenance medication was reviewed in detail including emphasizing most importantly the difference between maintenance and prns and under what circumstances the prns are to be triggered using an action plan format where appropriate.  Total time for H and P, chart review, counseling, reviewing neb/hfa device(s) , directly observing portions of ambulatory 02 saturation study/ and generating customized AVS unique to this office visit / same day charting > 30 min

## 2022-10-19 DIAGNOSIS — M791 Myalgia, unspecified site: Secondary | ICD-10-CM | POA: Diagnosis not present

## 2022-10-19 DIAGNOSIS — I10 Essential (primary) hypertension: Secondary | ICD-10-CM | POA: Diagnosis not present

## 2022-10-19 DIAGNOSIS — Z299 Encounter for prophylactic measures, unspecified: Secondary | ICD-10-CM | POA: Diagnosis not present

## 2022-10-19 DIAGNOSIS — J441 Chronic obstructive pulmonary disease with (acute) exacerbation: Secondary | ICD-10-CM | POA: Diagnosis not present

## 2022-10-19 DIAGNOSIS — I509 Heart failure, unspecified: Secondary | ICD-10-CM | POA: Diagnosis not present

## 2022-10-21 DIAGNOSIS — J441 Chronic obstructive pulmonary disease with (acute) exacerbation: Secondary | ICD-10-CM | POA: Diagnosis not present

## 2022-10-27 DIAGNOSIS — J449 Chronic obstructive pulmonary disease, unspecified: Secondary | ICD-10-CM | POA: Diagnosis not present

## 2022-11-06 ENCOUNTER — Other Ambulatory Visit (HOSPITAL_COMMUNITY): Payer: Self-pay | Admitting: Psychiatry

## 2022-11-12 ENCOUNTER — Other Ambulatory Visit (HOSPITAL_COMMUNITY): Payer: Self-pay | Admitting: Psychiatry

## 2022-11-13 ENCOUNTER — Encounter: Payer: Self-pay | Admitting: Cardiology

## 2022-11-13 ENCOUNTER — Ambulatory Visit: Payer: 59 | Attending: Cardiology | Admitting: Cardiology

## 2022-11-13 VITALS — BP 108/64 | HR 96 | Ht 63.0 in | Wt 165.2 lb

## 2022-11-13 DIAGNOSIS — E782 Mixed hyperlipidemia: Secondary | ICD-10-CM | POA: Diagnosis not present

## 2022-11-13 DIAGNOSIS — R0789 Other chest pain: Secondary | ICD-10-CM | POA: Diagnosis not present

## 2022-11-13 DIAGNOSIS — I10 Essential (primary) hypertension: Secondary | ICD-10-CM

## 2022-11-13 DIAGNOSIS — I2510 Atherosclerotic heart disease of native coronary artery without angina pectoris: Secondary | ICD-10-CM

## 2022-11-13 NOTE — Patient Instructions (Addendum)
Medication Instructions:   Continue all current medications.   Labwork:  none  Testing/Procedures:  none  Follow-Up:  6 weeks   Any Other Special Instructions Will Be Listed Below (If Applicable).  Nurse visit for medication review - needs to bring all medication bottles   If you need a refill on your cardiac medications before your next appointment, please call your pharmacy.
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
## 2022-11-16 ENCOUNTER — Ambulatory Visit: Payer: 59

## 2022-11-17 ENCOUNTER — Ambulatory Visit: Payer: 59 | Attending: Cardiology

## 2022-11-17 DIAGNOSIS — R5383 Other fatigue: Secondary | ICD-10-CM | POA: Diagnosis not present

## 2022-11-17 DIAGNOSIS — Z23 Encounter for immunization: Secondary | ICD-10-CM | POA: Diagnosis not present

## 2022-11-17 DIAGNOSIS — E7800 Pure hypercholesterolemia, unspecified: Secondary | ICD-10-CM | POA: Diagnosis not present

## 2022-11-17 DIAGNOSIS — Z0000 Encounter for general adult medical examination without abnormal findings: Secondary | ICD-10-CM | POA: Diagnosis not present

## 2022-11-17 DIAGNOSIS — Z79899 Other long term (current) drug therapy: Secondary | ICD-10-CM | POA: Diagnosis not present

## 2022-11-17 DIAGNOSIS — Z299 Encounter for prophylactic measures, unspecified: Secondary | ICD-10-CM | POA: Diagnosis not present

## 2022-11-17 DIAGNOSIS — I10 Essential (primary) hypertension: Secondary | ICD-10-CM | POA: Diagnosis not present

## 2022-11-17 NOTE — Patient Instructions (Addendum)
Medication Instructions:  Your physician recommends that you continue on your current medications as directed. Please refer to the Current Medication list given to you today.  Labwork: None  Testing/Procedures: None   Follow-Up: Your physician recommends that you schedule a follow-up appointment in: 12/28/22 @1 :00 PM   Any Other Special Instructions Will Be Listed Below (If Applicable).  If you need a refill on your cardiac medications before your next appointment, please call your pharmacy.

## 2022-11-17 NOTE — Progress Notes (Signed)
Patient presents today to verify medications for Dr.Branch She states she has no SOB, dizziness or chest pain  Patients BP is 130/80 O2 97 HR 91  Patients medication list has been completely updated with her current medications  Patient will follow up with E.Peck in November 2024

## 2022-11-21 DIAGNOSIS — J441 Chronic obstructive pulmonary disease with (acute) exacerbation: Secondary | ICD-10-CM | POA: Diagnosis not present

## 2022-11-26 DIAGNOSIS — J449 Chronic obstructive pulmonary disease, unspecified: Secondary | ICD-10-CM | POA: Diagnosis not present

## 2022-11-28 ENCOUNTER — Other Ambulatory Visit: Payer: Self-pay | Admitting: Internal Medicine

## 2022-11-28 DIAGNOSIS — Z1231 Encounter for screening mammogram for malignant neoplasm of breast: Secondary | ICD-10-CM

## 2022-12-01 ENCOUNTER — Inpatient Hospital Stay
Admission: RE | Admit: 2022-12-01 | Discharge: 2022-12-01 | Disposition: A | Discharge status: Home / Self Care | Payer: 59 | Source: Ambulatory Visit | Attending: Internal Medicine | Admitting: Internal Medicine

## 2022-12-01 DIAGNOSIS — Z1231 Encounter for screening mammogram for malignant neoplasm of breast: Secondary | ICD-10-CM

## 2022-12-07 ENCOUNTER — Other Ambulatory Visit (HOSPITAL_COMMUNITY): Payer: Self-pay | Admitting: Psychiatry

## 2022-12-10 ENCOUNTER — Telehealth (HOSPITAL_COMMUNITY): Payer: 59 | Admitting: Psychiatry

## 2022-12-10 ENCOUNTER — Encounter (HOSPITAL_COMMUNITY): Payer: Self-pay | Admitting: Psychiatry

## 2022-12-10 DIAGNOSIS — F331 Major depressive disorder, recurrent, moderate: Secondary | ICD-10-CM

## 2022-12-10 DIAGNOSIS — F419 Anxiety disorder, unspecified: Secondary | ICD-10-CM | POA: Diagnosis not present

## 2022-12-10 MED ORDER — QUETIAPINE FUMARATE 200 MG PO TABS: 200.0000 mg | ORAL_TABLET | Freq: Every day | ORAL | 2 refills | Status: DC

## 2022-12-10 MED ORDER — VORTIOXETINE HBR 20 MG PO TABS: 20.0000 mg | ORAL_TABLET | Freq: Every day | ORAL | 2 refills | Status: DC

## 2022-12-10 MED ORDER — CLONAZEPAM 0.5 MG PO TABS: ORAL_TABLET | ORAL | 2 refills | Status: DC
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
## 2022-12-11 ENCOUNTER — Telehealth (HOSPITAL_COMMUNITY): Payer: 59 | Admitting: Psychiatry

## 2022-12-21 DIAGNOSIS — J441 Chronic obstructive pulmonary disease with (acute) exacerbation: Secondary | ICD-10-CM | POA: Diagnosis not present

## 2022-12-22 DIAGNOSIS — Z79891 Long term (current) use of opiate analgesic: Secondary | ICD-10-CM | POA: Diagnosis not present

## 2022-12-22 DIAGNOSIS — M545 Low back pain, unspecified: Secondary | ICD-10-CM | POA: Diagnosis not present

## 2022-12-22 DIAGNOSIS — G894 Chronic pain syndrome: Secondary | ICD-10-CM | POA: Diagnosis not present

## 2022-12-22 DIAGNOSIS — M5412 Radiculopathy, cervical region: Secondary | ICD-10-CM | POA: Diagnosis not present

## 2022-12-22 DIAGNOSIS — M5416 Radiculopathy, lumbar region: Secondary | ICD-10-CM | POA: Diagnosis not present

## 2022-12-22 DIAGNOSIS — M79604 Pain in right leg: Secondary | ICD-10-CM | POA: Diagnosis not present

## 2022-12-22 DIAGNOSIS — M542 Cervicalgia: Secondary | ICD-10-CM | POA: Diagnosis not present

## 2022-12-27 DIAGNOSIS — J449 Chronic obstructive pulmonary disease, unspecified: Secondary | ICD-10-CM | POA: Diagnosis not present

## 2022-12-28 ENCOUNTER — Ambulatory Visit: Payer: 59 | Attending: Nurse Practitioner | Admitting: Nurse Practitioner

## 2022-12-28 ENCOUNTER — Encounter: Payer: Self-pay | Admitting: Nurse Practitioner

## 2022-12-28 VITALS — BP 110/70 | HR 98 | Ht 63.0 in | Wt 168.8 lb

## 2022-12-28 DIAGNOSIS — E785 Hyperlipidemia, unspecified: Secondary | ICD-10-CM | POA: Diagnosis not present

## 2022-12-28 DIAGNOSIS — R600 Localized edema: Secondary | ICD-10-CM

## 2022-12-28 DIAGNOSIS — I10 Essential (primary) hypertension: Secondary | ICD-10-CM | POA: Diagnosis not present

## 2022-12-28 DIAGNOSIS — R0789 Other chest pain: Secondary | ICD-10-CM

## 2022-12-28 DIAGNOSIS — I2510 Atherosclerotic heart disease of native coronary artery without angina pectoris: Secondary | ICD-10-CM

## 2022-12-28 MED ORDER — BLOOD PRESSURE MONITOR DEVI: 1.0000 | Freq: Every day | 0 refills | Status: DC

## 2022-12-28 MED ORDER — NITROGLYCERIN 0.4 MG SL SUBL: 0.4000 mg | SUBLINGUAL_TABLET | SUBLINGUAL | 3 refills | Status: DC | PRN

## 2022-12-28 MED ORDER — AMLODIPINE BESYLATE 5 MG PO TABS
5.0000 mg | ORAL_TABLET | Freq: Every day | ORAL | 1 refills | Status: DC
Start: 2022-12-28 — End: 2023-01-13

## 2022-12-28 NOTE — Progress Notes (Unsigned)
Office Visit    Patient Name: Hayley Juarez Date of Encounter: 12/28/2022 PCP:  Kirstie Peri, MD [facility]  Cardiologist:  Dina Rich, MD  Advanced Practice Provider:  No care team member to display Electrophysiologist:  None   Chief Complaint and HPI    Hayley Juarez is a 62 y.o. female with a hx of CAD, status post NSTEMI in March 2024, hypertension, hyperlipidemia, prior history of DVT, and COPD, who presents today for 6 week follow-up.  Admitted to [facility] in March 2024 as transfer from [facility] for NSTEMI.  Troponins elevated.  Echo revealed EF 55 to 60%, no RWMA.  Left heart cath revealed 60% distal LAD stenosis, 70% distal RCA stenosis, and 85% LP AV stenosis, PCI was deferred given no high-grade proximal stenoses, medical management recommended.  Was recommended to be on Eliquis and Plavix x 6 months, then stop Plavix.  During this admission, she was treated for COPD exacerbation.  Seen by Randall An, PA-C on May 06, 2022.  She noted muscle aches/fatigue since hospital discharge, said her symptoms have progressed since before hospital admission.  Continue to note occasional chest discomfort, improved with heat pack application along chest.  Denied any exertional symptoms.  Noted intermittent dyspnea, improved with use of inhalers.  Denied any other associated symptoms.  Was referred to pulmonology for history of COPD.  Admitted to [facility] later that month for acute exacerbation of COPD.  During hospital stay, did qualify for home oxygen at 2 L.  Admitted to [facility] 05/2022 for acute hypercapnic respiratory failure, was having worsening shortness of breath and cough.  Chest x-ray showed opacities, congestion versus atypical infection.  Placed on BiPAP and treated for COPD exacerbation.  Last seen by Dr. [name] 10/2022. Admitted to atypical CP. Was unsure what medication she was taking.  Dr. [name] recommended that in  setting of ACS and low bleeding risk would favor 1 year of Plavix.  Recommended to come back for nursing visit to clarify medication regimen.  Today she presents for 6 week follow-up. She states her chest pain has improved, describes sensation as a "twitching pain." Admits to intermittent leg edema at times. Denies any  shortness of breath, palpitations, syncope, presyncope, dizziness, orthopnea, PND,  significant weight changes, acute bleeding, or claudication.  EKGs/Labs/Other Studies Reviewed:   The following studies were reviewed today:   EKG:  EKG is not  ordered today.  Echo 04/2022:  1. Left ventricular ejection fraction, by estimation, is 55 to 60%. The  left ventricle has normal function. The left ventricle has no regional  wall motion abnormalities. There is mild concentric left ventricular  hypertrophy. Left ventricular diastolic  parameters are consistent with Grade I diastolic dysfunction (impaired  relaxation).   2. Right ventricular systolic function is normal. The right ventricular  size is normal. Tricuspid regurgitation signal is inadequate for assessing  PA pressure.   3. Left atrial size was mildly dilated.   4. The mitral valve is normal in structure. No evidence of mitral valve  regurgitation. No evidence of mitral stenosis.   5. The aortic valve is tricuspid. There is mild calcification of the  aortic valve. Aortic valve regurgitation is not visualized. No aortic  stenosis is present.   6. The inferior vena cava is normal in size with greater than 50%  respiratory variability, suggesting right atrial pressure of 3 mmHg.  LHC 04/2022:   Dist LAD lesion is 60% stenosed.  LPAV lesion is 85% stenosed.   Dist RCA lesion is 70% stenosed.   1.  Disease of the distal LAD, distal circumflex, and distal right coronary artery.  The right coronary artery is severely tortuous.  Given the lack of high-grade proximal obstructive disease PCI was deferred. 2.   Ventriculography and left heart catheterization demonstrated preserved ejection fraction and LVEDP of 17 mmHg.   Recommendation: Medical therapy . Review of Systems    All other systems reviewed and are otherwise negative except as noted above.  Physical Exam    VS:  BP 110/70   Pulse 98   Ht 5\' 3"  (1.6 m)   Wt 168 lb 12.8 oz (76.6 kg)   SpO2 95%   BMI 29.90 kg/m  , BMI Body mass index is 29.9 kg/m.  Wt Readings from Last 3 Encounters:  12/28/22 168 lb 12.8 oz (76.6 kg)  11/17/22 161 lb 12.8 oz (73.4 kg)  11/13/22 165 lb 3.2 oz (74.9 kg)     GEN: Well nourished, well developed, in no acute distress. HEENT: normal. Neck: Supple, no JVD, carotid bruits, or masses. Cardiac: S1/S2, RRR, no murmurs, rubs, or gallops. No clubbing, cyanosis. Nonpitting edema to BLE. Radials/PT 2+ and equal bilaterally.  Respiratory:  Respirations regular and unlabored, clear to auscultation bilaterally. GI: Soft, nontender, nondistended. MS: No deformity or atrophy. Skin: Warm and dry, no rash. Neuro:  Strength and sensation are intact. Psych: Normal affect.  Assessment & Plan    CAD, s/p NSTEMI, atypical chest pain Admits to atypical symptoms. Underwent cath earlier this year, PCI was deferred. Medical therapy recommended. Will reduce amlodipine to 5 mg daily - see below. No indication for ischemic evaluation. Continue rest of medication regimen. Will start NTG PRN for chest pain. Heart healthy diet and regular cardiovascular exercise encouraged. Care and ED precautions discussed.   HTN BP soft. Discussed to monitor BP at home at least 2 hours after medications and sitting for 5-10 minutes. Will reduce amlodipine to 5 mg daily to help improve leg edema  - see below. Given BP log, salty six and BP cuff. Heart healthy diet and regular cardiovascular exercise encouraged.   HLD LDL 86 10/2022. Continue rosuvastatin. Heart healthy diet and regular cardiovascular exercise encouraged.   Leg  edema Intermittent leg edema, nonpitting edema on exam today. Will reduce amlodipine to 5 mg daily. Recommended low salt, heart healthy diet and regular cardiovascular exercise encouraged and leg elevation PRN.  Disposition: Follow up in 8 week(s) with Dina Rich, MD or APP.  Signed, Sharlene Dory, NP

## 2022-12-28 NOTE — Patient Instructions (Addendum)
Medication Instructions:  Your physician has recommended you make the following change in your medication:  Please reduce amLODipine (NORVASC) to 5 MG tablet The proper use and anticipated side effects of nitroglycerine has been carefully explained.  If a single episode of chest pain is not relieved by one tablet, the patient will try another within 5 minutes; and if this doesn't relieve the pain, the patient is instructed to call 911 for transportation to an emergency department.    Labwork: None   Testing/Procedures: None   Follow-Up: Your physician recommends that you schedule a follow-up appointment in: 8 weeks   Any Other Special Instructions Will Be Listed Below (If Applicable).   If you need a refill on your cardiac medications before your next appointment, please call your pharmacy.

## 2023-01-08 ENCOUNTER — Encounter (HOSPITAL_COMMUNITY): Payer: Self-pay | Admitting: Psychiatry

## 2023-01-08 ENCOUNTER — Telehealth (HOSPITAL_COMMUNITY): Payer: 59 | Admitting: Psychiatry

## 2023-01-08 DIAGNOSIS — F331 Major depressive disorder, recurrent, moderate: Secondary | ICD-10-CM | POA: Diagnosis not present
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
## 2023-01-11 ENCOUNTER — Telehealth (HOSPITAL_COMMUNITY): Payer: Self-pay | Admitting: *Deleted

## 2023-01-11 NOTE — Telephone Encounter (Signed)
[facility] called stating they have a D.U.R rejections when they try to run the claim for patient Hayley Juarez. Per Hayley Juarez, she is just trying to make sure that provider is aware that patient is taking Hayley Juarez and this medication together. They just need a fax and or a call back stating that she is aware of the risk so she can override the D.U.R. Fax  number is [phone] and provider line is [phone].

## 2023-01-11 NOTE — Telephone Encounter (Signed)
done

## 2023-01-13 ENCOUNTER — Encounter: Payer: Self-pay | Admitting: Internal Medicine

## 2023-01-13 ENCOUNTER — Telehealth: Payer: Self-pay | Admitting: Internal Medicine

## 2023-01-13 ENCOUNTER — Ambulatory Visit (INDEPENDENT_AMBULATORY_CARE_PROVIDER_SITE_OTHER): Payer: 59 | Admitting: Internal Medicine

## 2023-01-13 VITALS — BP 122/80 | HR 103 | Ht 63.0 in | Wt 159.0 lb

## 2023-01-13 DIAGNOSIS — R0609 Other forms of dyspnea: Secondary | ICD-10-CM

## 2023-01-13 DIAGNOSIS — Z87891 Personal history of nicotine dependence: Secondary | ICD-10-CM

## 2023-01-13 DIAGNOSIS — J9611 Chronic respiratory failure with hypoxia: Secondary | ICD-10-CM | POA: Diagnosis not present

## 2023-01-13 NOTE — Assessment & Plan Note (Signed)
Quit smoking 05/2022 at admit for "copd exac" -  06/09/2022   continue breztri for now pending pfts - Allergy screen  06/09/22    Eos 1.7   -  07/15/2022  After extensive coaching inhaler device,  effectiveness =    60% from baseline of 30% > continue breztri > insurance did not cover and no change off sample as of 10/13/2022 > try duoneb / bud 0.25 mg bid (due to elevated Eos)  - 10/13/2022   Walked on RA  x  3  lap(s) =  approx 450  ft  @ nl  pace, stopped due to end of study  with lowest 02 sats 98%  - 01/13/2023 resting sats 98% RA  - 01/13/2023  After extensive coaching inhaler device,  effectiveness =    25% hfa   She is thoroughly confused re her meds with very poor hfa technique but luckily likely has only mild copd/ AB better since quit smoking so no change rx  F/u 3 m with pfts and with all meds in hand using a trust but verify approach to confirm accurate Medication  Reconciliation The principal here is that until we are certain that the  patients are doing what we've asked, it makes no sense to ask them to do more.
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
## 2023-01-13 NOTE — Assessment & Plan Note (Signed)
Referred for LDSCT  06/09/2022 >>> again 07/15/2022    Check results w/a         Each maintenance medication was reviewed in detail including emphasizing most importantly the difference between maintenance and prns and under what circumstances the prns are to be triggered using an action plan format where appropriate.  Total time for H and P, chart review, counseling, reviewing hfa device(s) and generating customized AVS unique to this office visit / same day charting > 30 min

## 2023-01-13 NOTE — Patient Instructions (Addendum)
Breztri Take 2 puffs first thing in am and then another 2 puffs about 12 hours later.    Work on inhaler technique:  relax and gently blow all the way out then take a nice smooth full deep breath back in, triggering the inhaler at same time you start breathing in.  Hold breath in for at least  5 seconds if you can. Blow out breztri thru nose. Rinse and gargle with water when done.  If mouth or throat bother you at all,  try brushing teeth/gums/tongue with arm and hammer toothpaste/ make a slurry and gargle and spit out.   >>>  take practice breaths like we did today with an empty inhaler   Make sure you check your oxygen saturation  AT  your highest level of activity (not after you stop)   to be sure it stays over 90% and adjust  02 flow upward to maintain this level if needed but remember to turn it back to zero when you stop as you don't need 02 at rest.   My office will be contacting you by phone for referral for PFTs - if you don't hear back from my office within one week please call us back or notify us thru MyChart and we'll address it right away.   Please schedule a follow up visit in 3 months but call sooner if needed  with all medications /inhalers/ solutions in hand so we can verify exactly what you are taking. This includes all medications from all doctors and over the counters

## 2023-01-13 NOTE — Telephone Encounter (Signed)
LVM for patient to call and discuss schedule the 3 month follow up with Dr. Sherene Sires and the PFT"s that were ordered

## 2023-01-26 DIAGNOSIS — J449 Chronic obstructive pulmonary disease, unspecified: Secondary | ICD-10-CM | POA: Diagnosis not present

## 2023-02-08 DIAGNOSIS — M5412 Radiculopathy, cervical region: Secondary | ICD-10-CM | POA: Diagnosis not present

## 2023-02-08 DIAGNOSIS — M5416 Radiculopathy, lumbar region: Secondary | ICD-10-CM | POA: Diagnosis not present

## 2023-02-08 DIAGNOSIS — M542 Cervicalgia: Secondary | ICD-10-CM | POA: Diagnosis not present

## 2023-02-08 DIAGNOSIS — M79604 Pain in right leg: Secondary | ICD-10-CM | POA: Diagnosis not present

## 2023-02-08 DIAGNOSIS — M545 Low back pain, unspecified: Secondary | ICD-10-CM | POA: Diagnosis not present

## 2023-02-08 DIAGNOSIS — G894 Chronic pain syndrome: Secondary | ICD-10-CM | POA: Diagnosis not present

## 2023-02-08 DIAGNOSIS — Z79891 Long term (current) use of opiate analgesic: Secondary | ICD-10-CM | POA: Diagnosis not present

## 2023-02-20 DIAGNOSIS — J441 Chronic obstructive pulmonary disease with (acute) exacerbation: Secondary | ICD-10-CM | POA: Diagnosis not present

## 2023-02-26 DIAGNOSIS — J449 Chronic obstructive pulmonary disease, unspecified: Secondary | ICD-10-CM | POA: Diagnosis not present

## 2023-03-01 ENCOUNTER — Telehealth: Payer: Self-pay | Admitting: Nurse Practitioner

## 2023-03-01 ENCOUNTER — Ambulatory Visit: Payer: 59 | Admitting: Nurse Practitioner

## 2023-03-01 NOTE — Telephone Encounter (Signed)
 Patient called stating that she was ordered a BP cuff to go around her wrist. She has never received it.

## 2023-03-02 MED ORDER — BLOOD PRESSURE CUFF MISC: 1.0000 | Freq: Once | 0 refills | Status: AC

## 2023-03-02 NOTE — Telephone Encounter (Signed)
 Spoke to patient who stated that [facility] told her that they do not carry bp cuffs. Pt requested that a new prescription be sent to [facility] in [place].   Prescription request completed.

## 2023-03-09 DIAGNOSIS — M545 Low back pain, unspecified: Secondary | ICD-10-CM | POA: Diagnosis not present

## 2023-03-09 DIAGNOSIS — Z79891 Long term (current) use of opiate analgesic: Secondary | ICD-10-CM | POA: Diagnosis not present

## 2023-03-09 DIAGNOSIS — M5412 Radiculopathy, cervical region: Secondary | ICD-10-CM | POA: Diagnosis not present

## 2023-03-09 DIAGNOSIS — M542 Cervicalgia: Secondary | ICD-10-CM | POA: Diagnosis not present

## 2023-03-09 DIAGNOSIS — M79604 Pain in right leg: Secondary | ICD-10-CM | POA: Diagnosis not present

## 2023-03-09 DIAGNOSIS — G894 Chronic pain syndrome: Secondary | ICD-10-CM | POA: Diagnosis not present

## 2023-03-09 DIAGNOSIS — M5416 Radiculopathy, lumbar region: Secondary | ICD-10-CM | POA: Diagnosis not present

## 2023-03-18 ENCOUNTER — Ambulatory Visit (HOSPITAL_COMMUNITY)
Admission: RE | Admit: 2023-03-18 | Discharge: 2023-03-18 | Disposition: A | Discharge status: Home / Self Care | Payer: 59 | Source: Ambulatory Visit | Attending: Internal Medicine | Admitting: Internal Medicine

## 2023-03-18 DIAGNOSIS — J9611 Chronic respiratory failure with hypoxia: Secondary | ICD-10-CM | POA: Diagnosis not present

## 2023-03-18 DIAGNOSIS — R0609 Other forms of dyspnea: Secondary | ICD-10-CM | POA: Diagnosis not present

## 2023-03-18 LAB — PULMONARY FUNCTION TEST
DL/VA % pred: 91 %
DL/VA: 3.88 ml/min/mmHg/L
DLCO unc % pred: 48 %
DLCO unc: 9 ml/min/mmHg
FEF 25-75 Post: 0.6 L/s
FEF 25-75 Pre: 0.83 L/s
FEF2575-%Change-Post: -27 %
FEF2575-%Pred-Post: 28 %
FEF2575-%Pred-Pre: 39 %
FEV1-%Change-Post: -2 %
FEV1-%Pred-Post: 55 %
FEV1-%Pred-Pre: 57 %
FEV1-Post: 1.28 L
FEV1-Pre: 1.31 L
FEV1FVC-%Change-Post: 19 %
FEV1FVC-%Pred-Pre: 92 %
FEV6-%Change-Post: -15 %
FEV6-%Pred-Post: 51 %
FEV6-%Pred-Pre: 61 %
FEV6-Post: 1.48 L
FEV6-Pre: 1.75 L
FEV6FVC-%Change-Post: 1 %
FEV6FVC-%Pred-Post: 104 %
FEV6FVC-%Pred-Pre: 102 %
FVC-%Change-Post: -18 %
FVC-%Pred-Post: 49 %
FVC-%Pred-Pre: 61 %
FVC-Post: 1.49 L
FVC-Pre: 1.82 L
Post FEV1/FVC ratio: 86 %
Post FEV6/FVC ratio: 100 %
Pre FEV1/FVC ratio: 72 %
Pre FEV6/FVC Ratio: 98 %
RV % pred: 298 %
RV: 5.82 L
TLC % pred: 160 %
TLC: 7.62 L

## 2023-03-18 MED ORDER — ALBUTEROL SULFATE (2.5 MG/3ML) 0.083% IN NEBU
2.5000 mg | INHALATION_SOLUTION | Freq: Once | RESPIRATORY_TRACT | Status: AC
  Administered 2023-03-18: 2.5 mg via RESPIRATORY_TRACT

## 2023-03-23 DIAGNOSIS — J441 Chronic obstructive pulmonary disease with (acute) exacerbation: Secondary | ICD-10-CM | POA: Diagnosis not present

## 2023-03-29 DIAGNOSIS — J449 Chronic obstructive pulmonary disease, unspecified: Secondary | ICD-10-CM | POA: Diagnosis not present

## 2023-04-02 ENCOUNTER — Encounter: Payer: Self-pay | Admitting: Nurse Practitioner

## 2023-04-02 ENCOUNTER — Ambulatory Visit: Payer: 59 | Attending: Nurse Practitioner | Admitting: Nurse Practitioner

## 2023-04-02 VITALS — BP 121/70 | HR 90 | Resp 96 | Ht 63.0 in | Wt 153.0 lb

## 2023-04-02 DIAGNOSIS — I2510 Atherosclerotic heart disease of native coronary artery without angina pectoris: Secondary | ICD-10-CM

## 2023-04-02 DIAGNOSIS — E785 Hyperlipidemia, unspecified: Secondary | ICD-10-CM

## 2023-04-02 DIAGNOSIS — R0789 Other chest pain: Secondary | ICD-10-CM

## 2023-04-02 DIAGNOSIS — I10 Essential (primary) hypertension: Secondary | ICD-10-CM

## 2023-04-02 DIAGNOSIS — Z86718 Personal history of other venous thrombosis and embolism: Secondary | ICD-10-CM | POA: Diagnosis not present

## 2023-04-02 DIAGNOSIS — R600 Localized edema: Secondary | ICD-10-CM | POA: Diagnosis not present

## 2023-04-02 MED ORDER — BLOOD PRESSURE MONITOR DEVI
1.0000 | Freq: Every day | 0 refills | Status: AC
Start: 2023-04-02 — End: ?

## 2023-04-02 NOTE — Progress Notes (Addendum)
 Office Visit    Patient Name: Hayley Juarez Date of Encounter: 04/02/2023 PCP:  Maree Isles, MD [facility]  Cardiologist:  Alvan Carrier, MD  Advanced Practice Provider:  No care team member to display Electrophysiologist:  None   Chief Complaint and HPI    Hayley Juarez is a 63 y.o. female with a hx of CAD, status post NSTEMI in March 2024, hypertension, hyperlipidemia, prior history of DVT, chronic venous insufficiency, s/p laser ablation of right great saphenous vein in 2022, and COPD, who presents today for follow-up.  [facility] March 20[facility] as transfer from [facility] for NSTEMI.  Troponins elevated.  Echo revealed EF 55 to 60%, no RWMA.  Left heart cath revealed 60% distal LAD stenosis, 70% distal RCA stenosis, and 85% LP AV stenosis, PCI was deferred given no high-grade proximal stenoses, medical management recommended.  Was recommended to be on Eliquis  and Plavix  x 6 months, then stop Plavix .  During this admission, she was treated for COPD exacerbation.  Seen by Laymon Qua, PA-C on May 06, 2022.  She noted muscle aches/fatigue since hospital discharge, said her symptoms have progressed since before hospital admission.  Continue to note occasional chest discomfort, improved with heat pack application along chest.  Denied any exertional symptoms.  Noted intermittent dyspnea, improved with use of inhalers.  Denied any other associated symptoms.  Was referred to pulmonology for history of COPD.  Admitted to [facility] later that month for acute exacerbation of COPD.  During hospital stay, did qualify for home oxygen  at 2 L.  Admitted to [facility] 05/2022 for acute hypercapnic respiratory failure, was having worsening shortness of breath and cough.  Chest x-ray showed opacities, congestion versus atypical infection.  Placed on BiPAP and treated for COPD exacerbation.  Last seen by Dr. Alvan 10/2022. Admitted to  atypical CP. Was unsure what medication she was taking.  Dr. Alvan recommended that in setting of ACS and low bleeding risk would favor 1 year of Plavix .  Recommended to come back for nursing visit to clarify medication regimen.  12/28/2022 - Today she presents for 6 week follow-up. She states her chest pain has improved, describes sensation as a twitching pain. Admits to intermittent leg edema at times. Denies any  shortness of breath, palpitations, syncope, presyncope, dizziness, orthopnea, PND,  significant weight changes, acute bleeding, or claudication.  Today she presents for follow-up.  She says she has not been doing well recently, admits to some family problems. Denies any red flag signs/symptoms.  Admits to intermittent chest pain that has been chronic, also admits to generalized pains and says her stomach has been hurting, legs have been hurting.  She says she took a hydrocodone medication today that has helped ease her pain.  Says she has not taken nitroglycerin .  Admits to sensation of feet burning/neuropathy symptoms in the morning.  When describing her chest pain, she says this occurs along both sides of her chest, denies any central chest pain, and occurs underneath her breast tissue, she does admit to left arm pain but says she had procedure along this side in the past.  She says alleviating factors of her chest pain include hydrocodone, Lyrica, and nitroglycerin .  She also admits aggravating factors include emotional upset.  Denies any recent worsening symptoms or active chest pain. Denies any shortness of breath, palpitations, syncope, presyncope, dizziness, orthopnea, PND, swelling or significant weight changes, acute bleeding, or claudication.  She says reducing amlodipine   to 5 mg daily helped improve her leg swelling.   EKGs/Labs/Other Studies Reviewed:   The following studies were reviewed today:   EKG:   EKG Interpretation Date/Time:  Friday April 02 2023 13:23:48  EST Ventricular Rate:  87 PR Interval:  130 QRS Duration:  84 QT Interval:  352 QTC Calculation: 423 R Axis:   54  Text Interpretation: Normal sinus rhythm Nonspecific ST and T wave abnormality When compared with ECG of 30-Apr-2022 02:22, No significant change was found Confirmed by Miriam Norris [phone]) on 04/02/2023 1:26:05 PM    Echo 04/2022:  1. Left ventricular ejection fraction, by estimation, is 55 to 60%. The  left ventricle has normal function. The left ventricle has no regional  wall motion abnormalities. There is mild concentric left ventricular  hypertrophy. Left ventricular diastolic  parameters are consistent with Grade I diastolic dysfunction (impaired  relaxation).   2. Right ventricular systolic function is normal. The right ventricular  size is normal. Tricuspid regurgitation signal is inadequate for assessing  PA pressure.   3. Left atrial size was mildly dilated.   4. The mitral valve is normal in structure. No evidence of mitral valve  regurgitation. No evidence of mitral stenosis.   5. The aortic valve is tricuspid. There is mild calcification of the  aortic valve. Aortic valve regurgitation is not visualized. No aortic  stenosis is present.   6. The inferior vena cava is normal in size with greater than 50%  respiratory variability, suggesting right atrial pressure of 3 mmHg.  LHC 04/2022:   Dist LAD lesion is 60% stenosed.   LPAV lesion is 85% stenosed.   Dist RCA lesion is 70% stenosed.   1.  Disease of the distal LAD, distal circumflex, and distal right coronary artery.  The right coronary artery is severely tortuous.  Given the lack of high-grade proximal obstructive disease PCI was deferred. 2.  Ventriculography and left heart catheterization demonstrated preserved ejection fraction and LVEDP of 17 mmHg.   Recommendation: Medical therapy . Review of Systems    All other systems reviewed and are otherwise negative except as noted above.  Physical  Exam    VS:  BP 121/70 (BP Location: Left Arm, Patient Position: Sitting, Cuff Size: Normal)   Pulse 90   Resp (!) 96   Ht 5' 3 (1.6 m)   Wt 153 lb (69.4 kg)   BMI 27.10 kg/m  , BMI Body mass index is 27.1 kg/m.  Wt Readings from Last 3 Encounters:  04/02/23 153 lb (69.4 kg)  01/13/23 159 lb (72.1 kg)  12/28/22 168 lb 12.8 oz (76.6 kg)     GEN: Well nourished, well developed, in no acute distress. HEENT: normal. Neck: Supple, no JVD, carotid bruits, or masses. Cardiac: S1/S2, RRR, no murmurs, rubs, or gallops. No clubbing, cyanosis. No edema to BLE. Radials/PT 2+ and equal bilaterally.  Respiratory:  Respirations regular and unlabored, clear to auscultation bilaterally. GI: Soft, nontender, nondistended. MS: No deformity or atrophy. Skin: Warm and dry, no rash. Neuro:  Strength and sensation are intact. Psych: Normal affect.  Assessment & Plan    CAD, s/p NSTEMI, atypical chest pain Admits to chronic, intermittent and atypical symptoms. Possibly MSK in origin. See LHC report noted above from 2024, PCI was deferred. Medical therapy recommended. No indication for ischemic evaluation. Continue rest of medication regimen.  Heart healthy diet and regular cardiovascular exercise encouraged. Care and ED precautions discussed. Will consult Dr. Alvan to see if Plavix  can be  stopped as this was continue this for 6 months.   Addendum 04/14/2023: Dr. Alvan recommended to continue until March 2025.   HTN BP elevated on arrival, recheck BP was at goal. Discussed to monitor BP at home at least 2 hours after medications and sitting for 5-10 minutes. Will resend Rx for BP cuff as she did not receive this after last office visit. Heart healthy diet and regular cardiovascular exercise encouraged.  She will call us  in 2 to 3 weeks after she receives her BP cuff and update us  about her BP trends.  If BP is not at goal, plan to increase Lopressor  to 50 mg twice daily.  HLD LDL 86 10/2022. Continue  rosuvastatin . Heart healthy diet and regular cardiovascular exercise encouraged.   Leg edema, prior hx of DVT This has resolved after decreasing amlodipine  to 5 mg daily.  No edema noted on exam.  Recommended low salt, heart healthy diet and regular cardiovascular exercise encouraged and leg elevation PRN.  She is on Eliquis  due to past history of DVT, this is being managed by PCP and I recommended she follow-up with PCP regarding medical management.   Disposition: Follow up in 6-8 week(s) with Alvan Carrier, MD or APP.  Signed, Almarie Crate, NP

## 2023-04-02 NOTE — Patient Instructions (Addendum)

## 2023-04-06 ENCOUNTER — Other Ambulatory Visit (HOSPITAL_COMMUNITY): Payer: Self-pay | Admitting: Psychiatry

## 2023-04-06 DIAGNOSIS — G894 Chronic pain syndrome: Secondary | ICD-10-CM | POA: Diagnosis not present

## 2023-04-06 DIAGNOSIS — M5412 Radiculopathy, cervical region: Secondary | ICD-10-CM | POA: Diagnosis not present

## 2023-04-06 DIAGNOSIS — M545 Low back pain, unspecified: Secondary | ICD-10-CM | POA: Diagnosis not present

## 2023-04-06 DIAGNOSIS — M5416 Radiculopathy, lumbar region: Secondary | ICD-10-CM | POA: Diagnosis not present

## 2023-04-06 DIAGNOSIS — Z79891 Long term (current) use of opiate analgesic: Secondary | ICD-10-CM | POA: Diagnosis not present

## 2023-04-06 DIAGNOSIS — M79604 Pain in right leg: Secondary | ICD-10-CM | POA: Diagnosis not present

## 2023-04-06 DIAGNOSIS — M542 Cervicalgia: Secondary | ICD-10-CM | POA: Diagnosis not present

## 2023-04-06 NOTE — Telephone Encounter (Signed)
Call for appt
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
## 2023-04-19 ENCOUNTER — Ambulatory Visit (INDEPENDENT_AMBULATORY_CARE_PROVIDER_SITE_OTHER): Payer: 59 | Admitting: Internal Medicine

## 2023-04-19 ENCOUNTER — Encounter: Payer: Self-pay | Admitting: Internal Medicine

## 2023-04-19 VITALS — BP 137/83 | HR 90 | Ht 63.0 in | Wt 160.8 lb

## 2023-04-19 DIAGNOSIS — R0609 Other forms of dyspnea: Secondary | ICD-10-CM | POA: Diagnosis not present

## 2023-04-19 DIAGNOSIS — Z87891 Personal history of nicotine dependence: Secondary | ICD-10-CM

## 2023-04-19 NOTE — Patient Instructions (Addendum)
 Plan A = Automatic = Always=    Breztri Take 2 puffs first thing in am and then another 2 puffs about 12 hours later.    Work on inhaler technique:  relax and gently blow all the way out then take a nice smooth full deep breath back in, triggering the inhaler at same time you start breathing in.  Hold breath in for at least  5 seconds if you can. Blow out breztri  thru nose. Rinse and gargle with water when done.  If mouth or throat bother you at all,  try brushing teeth/gums/tongue with arm and hammer toothpaste/ make a slurry and gargle and spit out.     Plan B = Backup (to supplement plan A, not to replace it) Only use your albuterol inhaler as a rescue medication to be used if you can't catch your breath by resting or doing a relaxed purse lip breathing pattern.  - The less you use it, the better it will work when you need it. - Ok to use the inhaler up to 2 puffs  every 4 hours if you must but call for appointment if use goes up over your usual need - Don't leave home without it !!  (think of it like the spare tire for your car)   Plan C = Crisis (instead of Plan B but only if Plan B stops working) - only use your albuterol nebulizer if you first try Plan B and it fails to help > ok to use the nebulizer up to every 4 hours but if start needing it regularly call for immediate appointment   Also  Ok to try albuterol 15 min before an activity (on alternating days with inhaler / nebuilzer/ and nothing )  that you know would usually make you short of breath and see if it makes any difference and if makes none then don't take albuterol after activity unless you can't catch your breath as this means it's the resting that helps, not the albuterol.   Please schedule a follow up visit in 3 months but call sooner if needed

## 2023-04-19 NOTE — Assessment & Plan Note (Addendum)
 Quit smoking 05/2022 at admit for "copd exac" -  06/09/2022   continue breztri for now pending pfts - Allergy screen  06/09/22    Eos 1.7   -  07/15/2022  After extensive coaching inhaler device,  effectiveness =    60% from baseline of 30% > continue breztri > insurance did not cover and no change off sample as of 10/13/2022 > try duoneb / bud 0.25 mg bid (due to elevated Eos)  - 10/13/2022   Walked on RA  x  3  lap(s) =  approx 450  ft  @ nl  pace, stopped due to end of study  with lowest 02 sats 98%  - 01/13/2023 resting sats 98% RA  - PFT's  03/18/23  FEV1 1.31 (57 % ) ratio 0.72  p 0 % improvement from saba p 0 prior to study with DLCO  9 (48%)   and FV curve minimal concavity and ERV 35 at wt 161   - 04/19/2023  After extensive coaching inhaler device,  effectiveness =    50% ( late trigger/ short ti)  - 04/19/2023   Walked on RA  x  3  lap(s) =  approx 450  ft  @ slow/ cane  pace, stopped due to end of study  with lowest 02 sats 89% on RA    Lucky she doesn't have much airflow obst as her insight into how/ when to use her inhalers if very limited  Re SABA :  I spent extra time with pt today reviewing appropriate use of albuterol for prn use on exertion with the following points: 1) saba is for relief of sob that does not improve by walking a slower pace or resting but rather if the pt does not improve after trying this first. 2) If the pt is convinced, as many are, that saba helps recover from activity faster then it's easy to tell if this is the case by re-challenging : ie stop, take the inhaler, then p 5 minutes try the exact same activity (intensity of workload) that just caused the symptoms and see if they are substantially diminished or not after saba 3) if there is an activity that reproducibly causes the symptoms, try the saba 15 min before the activity on alternate days   If in fact the saba really does help, then fine to continue to use it prn but advised may need to look closer at the  maintenance regimen(for now breztri)  being used to achieve better control of airways disease with exertion.    See ABC action plan on AVS/continue bretri for now but low threshold to step down to symbicort for AB component      f/u in 3 m, sooner if needed   Each maintenance medication was reviewed in detail including emphasizing most importantly the difference between maintenance and prns and under what circumstances the prns are to be triggered using an action plan format where appropriate.  Total time for H and P, chart review, counseling, reviewing hfa/ neb device(s) , directly observing portions of ambulatory 02 saturation study/ and generating customized AVS unique to this office visit / same day charting = 32 min

## 2023-04-19 NOTE — Assessment & Plan Note (Signed)
 Referred for LDSCT  06/09/2022 >>> again 07/15/2022  > did not qualify due to insufficient pk years   >>> This makes sense given the paucity of findings on pfts that support dx of copd

## 2023-04-23 DIAGNOSIS — J441 Chronic obstructive pulmonary disease with (acute) exacerbation: Secondary | ICD-10-CM | POA: Diagnosis not present

## 2023-04-26 ENCOUNTER — Other Ambulatory Visit: Payer: Self-pay | Admitting: Nurse Practitioner

## 2023-04-26 DIAGNOSIS — J449 Chronic obstructive pulmonary disease, unspecified: Secondary | ICD-10-CM | POA: Diagnosis not present

## 2023-05-04 DIAGNOSIS — M542 Cervicalgia: Secondary | ICD-10-CM | POA: Diagnosis not present

## 2023-05-04 DIAGNOSIS — M79604 Pain in right leg: Secondary | ICD-10-CM | POA: Diagnosis not present

## 2023-05-04 DIAGNOSIS — Z79891 Long term (current) use of opiate analgesic: Secondary | ICD-10-CM | POA: Diagnosis not present

## 2023-05-04 DIAGNOSIS — G894 Chronic pain syndrome: Secondary | ICD-10-CM | POA: Diagnosis not present

## 2023-05-04 DIAGNOSIS — M545 Low back pain, unspecified: Secondary | ICD-10-CM | POA: Diagnosis not present

## 2023-05-04 DIAGNOSIS — M5416 Radiculopathy, lumbar region: Secondary | ICD-10-CM | POA: Diagnosis not present

## 2023-05-07 DIAGNOSIS — Z79891 Long term (current) use of opiate analgesic: Secondary | ICD-10-CM | POA: Diagnosis not present

## 2023-05-07 DIAGNOSIS — G894 Chronic pain syndrome: Secondary | ICD-10-CM | POA: Diagnosis not present

## 2023-05-07 DIAGNOSIS — M542 Cervicalgia: Secondary | ICD-10-CM | POA: Diagnosis not present

## 2023-05-07 DIAGNOSIS — M5412 Radiculopathy, cervical region: Secondary | ICD-10-CM | POA: Diagnosis not present

## 2023-05-07 DIAGNOSIS — M5416 Radiculopathy, lumbar region: Secondary | ICD-10-CM | POA: Diagnosis not present

## 2023-05-14 ENCOUNTER — Ambulatory Visit: Payer: 59 | Attending: Nurse Practitioner | Admitting: Nurse Practitioner

## 2023-05-14 NOTE — Progress Notes (Deleted)
 Office Visit    Patient Name: Hayley Juarez Date of Encounter: 04/02/2023 PCP:  Hayley Peri, MD [facility]  Cardiologist:  Hayley Rich, MD  Advanced Practice Provider:  No care team member to display Electrophysiologist:  None   Chief Complaint and HPI    Hayley Juarez is a 63 y.o. female with a hx of CAD, status post NSTEMI in March 2024, hypertension, hyperlipidemia, prior history of DVT, chronic venous insufficiency, s/p laser ablation of right great saphenous vein in 2022, and COPD, who presents today for follow-up.  Admitted to [facility] in March 2024 as transfer from [facility] for NSTEMI.  Troponins elevated.  Echo revealed EF 55 to 60%, no RWMA.  Left heart cath revealed 60% distal LAD stenosis, 70% distal RCA stenosis, and 85% LP AV stenosis, PCI was deferred given no high-grade proximal stenoses, medical management recommended.  Was recommended to be on Eliquis and Plavix x 6 months, then stop Plavix.  During this admission, she was treated for COPD exacerbation.  Seen by Hayley An, PA-C on May 06, 2022.  She noted muscle aches/fatigue since hospital discharge, said her symptoms have progressed since before hospital admission.  Continue to note occasional chest discomfort, improved with heat pack application along chest.  Denied any exertional symptoms.  Noted intermittent dyspnea, improved with use of inhalers.  Denied any other associated symptoms.  Was referred to pulmonology for history of COPD.  Admitted to [facility] later that month for acute exacerbation of COPD.  During hospital stay, did qualify for home oxygen at 2 L.  Admitted to [facility] 05/2022 for acute hypercapnic respiratory failure, was having worsening shortness of breath and cough.  Chest x-ray showed opacities, congestion versus atypical infection.  Placed on BiPAP and treated for COPD exacerbation.  Last seen by Dr. [name] 10/2022. Admitted to  atypical CP. Was unsure what medication she was taking.  Dr. [name] recommended that in setting of ACS and low bleeding risk would favor 1 year of Plavix.  Recommended to come back for nursing visit to clarify medication regimen.  12/28/2022 - Today she presents for 6 week follow-up. She states her chest pain has improved, describes sensation as a "twitching pain." Admits to intermittent leg edema at times. Denies any  shortness of breath, palpitations, syncope, presyncope, dizziness, orthopnea, PND,  significant weight changes, acute bleeding, or claudication.  Today she presents for follow-up.  She says she has not been doing well recently, admits to some family problems. Denies any red flag signs/symptoms.  Admits to intermittent chest pain that has been chronic, also admits to generalized pains and says her stomach has been hurting, legs have been hurting.  She says she took a hydrocodone medication today that has helped ease her pain.  Says she has not taken nitroglycerin.  Admits to sensation of feet burning/neuropathy symptoms in the morning.  When describing her chest pain, she says this occurs along both sides of her chest, denies any central chest pain, and occurs underneath her breast tissue, she does admit to left arm pain but says she had procedure along this side in the past.  She says alleviating factors of her chest pain include hydrocodone, Lyrica, and nitroglycerin.  She also admits aggravating factors include emotional upset.  Denies any recent worsening symptoms or active chest pain. Denies any shortness of breath, palpitations, syncope, presyncope, dizziness, orthopnea, PND, swelling or significant weight changes, acute bleeding, or claudication.  She says reducing amlodipine  to 5 mg daily helped improve her leg swelling.   EKGs/Labs/Other Studies Reviewed:   The following studies were reviewed today:   EKG:        Echo 04/2022:  1. Left ventricular ejection fraction, by  estimation, is 55 to 60%. The  left ventricle has normal function. The left ventricle has no regional  wall motion abnormalities. There is mild concentric left ventricular  hypertrophy. Left ventricular diastolic  parameters are consistent with Grade I diastolic dysfunction (impaired  relaxation).   2. Right ventricular systolic function is normal. The right ventricular  size is normal. Tricuspid regurgitation signal is inadequate for assessing  PA pressure.   3. Left atrial size was mildly dilated.   4. The mitral valve is normal in structure. No evidence of mitral valve  regurgitation. No evidence of mitral stenosis.   5. The aortic valve is tricuspid. There is mild calcification of the  aortic valve. Aortic valve regurgitation is not visualized. No aortic  stenosis is present.   6. The inferior vena cava is normal in size with greater than 50%  respiratory variability, suggesting right atrial pressure of 3 mmHg.  LHC 04/2022:   Dist LAD lesion is 60% stenosed.   LPAV lesion is 85% stenosed.   Dist RCA lesion is 70% stenosed.   1.  Disease of the distal LAD, distal circumflex, and distal right coronary artery.  The right coronary artery is severely tortuous.  Given the lack of high-grade proximal obstructive disease PCI was deferred. 2.  Ventriculography and left heart catheterization demonstrated preserved ejection fraction and LVEDP of 17 mmHg.   Recommendation: Medical therapy . Review of Systems    All other systems reviewed and are otherwise negative except as noted above.  Physical Exam    VS:  There were no vitals taken for this visit. , BMI There is no height or weight on file to calculate BMI.  Wt Readings from Last 3 Encounters:  04/19/23 160 lb 12.8 oz (72.9 kg)  04/02/23 153 lb (69.4 kg)  01/13/23 159 lb (72.1 kg)     GEN: Well nourished, well developed, in no acute distress. HEENT: normal. Neck: Supple, no JVD, carotid bruits, or masses. Cardiac: S1/S2, RRR,  no murmurs, rubs, or gallops. No clubbing, cyanosis. No edema to BLE. Radials/PT 2+ and equal bilaterally.  Respiratory:  Respirations regular and unlabored, clear to auscultation bilaterally. GI: Soft, nontender, nondistended. MS: No deformity or atrophy. Skin: Warm and dry, no rash. Neuro:  Strength and sensation are intact. Psych: Normal affect.  Assessment & Plan    CAD, s/p NSTEMI, atypical chest pain Admits to chronic, intermittent and atypical symptoms. Possibly MSK in origin. See LHC report noted above from 2024, PCI was deferred. Medical therapy recommended. No indication for ischemic evaluation. Continue rest of medication regimen.  Heart healthy diet and regular cardiovascular exercise encouraged. Care and ED precautions discussed. Will consult Dr. [name] to see if Plavix can be stopped as this was continue this for 6 months.   Addendum 04/14/2023: Dr. [name] recommended to continue until March 2025.   HTN BP elevated on arrival, recheck BP was at goal. Discussed to monitor BP at home at least 2 hours after medications and sitting for 5-10 minutes. Will resend Rx for BP cuff as she did not receive this after last office visit. Heart healthy diet and regular cardiovascular exercise encouraged.  She will call us in 2 to 3 weeks after she receives her BP cuff and update [place]  about her BP trends.  If BP is not at goal, plan to increase Lopressor to 50 mg twice daily.  HLD LDL 86 10/2022. Continue rosuvastatin. Heart healthy diet and regular cardiovascular exercise encouraged.   Leg edema, prior hx of DVT This has resolved after decreasing amlodipine to 5 mg daily.  No edema noted on exam.  Recommended low salt, heart healthy diet and regular cardiovascular exercise encouraged and leg elevation PRN.  She is on Eliquis due to past history of DVT, this is being managed by PCP and I recommended she follow-up with PCP regarding medical management.   Disposition: Follow up in 6-8 week(s) with  Hayley Rich, MD or APP.  Signed, Sharlene Dory, NP

## 2023-05-21 DIAGNOSIS — J441 Chronic obstructive pulmonary disease with (acute) exacerbation: Secondary | ICD-10-CM | POA: Diagnosis not present

## 2023-05-24 DIAGNOSIS — M5030 Other cervical disc degeneration, unspecified cervical region: Secondary | ICD-10-CM | POA: Diagnosis not present

## 2023-05-24 DIAGNOSIS — E7800 Pure hypercholesterolemia, unspecified: Secondary | ICD-10-CM | POA: Diagnosis not present

## 2023-05-24 DIAGNOSIS — Z5989 Other problems related to housing and economic circumstances: Secondary | ICD-10-CM | POA: Diagnosis not present

## 2023-05-24 DIAGNOSIS — Z299 Encounter for prophylactic measures, unspecified: Secondary | ICD-10-CM | POA: Diagnosis not present

## 2023-05-24 DIAGNOSIS — M9961 Osseous and subluxation stenosis of intervertebral foramina of cervical region: Secondary | ICD-10-CM | POA: Diagnosis not present

## 2023-05-24 DIAGNOSIS — M48061 Spinal stenosis, lumbar region without neurogenic claudication: Secondary | ICD-10-CM | POA: Diagnosis not present

## 2023-05-24 DIAGNOSIS — I10 Essential (primary) hypertension: Secondary | ICD-10-CM | POA: Diagnosis not present

## 2023-05-24 DIAGNOSIS — Z79899 Other long term (current) drug therapy: Secondary | ICD-10-CM | POA: Diagnosis not present

## 2023-05-24 DIAGNOSIS — M5412 Radiculopathy, cervical region: Secondary | ICD-10-CM | POA: Diagnosis not present

## 2023-05-24 DIAGNOSIS — Z981 Arthrodesis status: Secondary | ICD-10-CM | POA: Diagnosis not present

## 2023-05-24 DIAGNOSIS — M542 Cervicalgia: Secondary | ICD-10-CM | POA: Diagnosis not present

## 2023-05-24 DIAGNOSIS — Z0000 Encounter for general adult medical examination without abnormal findings: Secondary | ICD-10-CM | POA: Diagnosis not present

## 2023-05-24 DIAGNOSIS — Z7189 Other specified counseling: Secondary | ICD-10-CM | POA: Diagnosis not present

## 2023-05-24 DIAGNOSIS — R5383 Other fatigue: Secondary | ICD-10-CM | POA: Diagnosis not present

## 2023-05-24 DIAGNOSIS — M5134 Other intervertebral disc degeneration, thoracic region: Secondary | ICD-10-CM | POA: Diagnosis not present

## 2023-05-24 DIAGNOSIS — M5416 Radiculopathy, lumbar region: Secondary | ICD-10-CM | POA: Diagnosis not present

## 2023-05-24 DIAGNOSIS — M5136 Other intervertebral disc degeneration, lumbar region with discogenic back pain only: Secondary | ICD-10-CM | POA: Diagnosis not present

## 2023-05-24 DIAGNOSIS — M545 Low back pain, unspecified: Secondary | ICD-10-CM | POA: Diagnosis not present

## 2023-05-27 DIAGNOSIS — J449 Chronic obstructive pulmonary disease, unspecified: Secondary | ICD-10-CM | POA: Diagnosis not present

## 2023-06-01 DIAGNOSIS — M79604 Pain in right leg: Secondary | ICD-10-CM | POA: Diagnosis not present

## 2023-06-01 DIAGNOSIS — Z79891 Long term (current) use of opiate analgesic: Secondary | ICD-10-CM | POA: Diagnosis not present

## 2023-06-01 DIAGNOSIS — M5412 Radiculopathy, cervical region: Secondary | ICD-10-CM | POA: Diagnosis not present

## 2023-06-01 DIAGNOSIS — M542 Cervicalgia: Secondary | ICD-10-CM | POA: Diagnosis not present

## 2023-06-01 DIAGNOSIS — M5416 Radiculopathy, lumbar region: Secondary | ICD-10-CM | POA: Diagnosis not present

## 2023-06-01 DIAGNOSIS — G894 Chronic pain syndrome: Secondary | ICD-10-CM | POA: Diagnosis not present

## 2023-06-01 DIAGNOSIS — M545 Low back pain, unspecified: Secondary | ICD-10-CM | POA: Diagnosis not present

## 2023-06-04 DIAGNOSIS — M25561 Pain in right knee: Secondary | ICD-10-CM | POA: Diagnosis not present

## 2023-06-04 DIAGNOSIS — M25562 Pain in left knee: Secondary | ICD-10-CM | POA: Diagnosis not present

## 2023-06-04 DIAGNOSIS — G894 Chronic pain syndrome: Secondary | ICD-10-CM | POA: Diagnosis not present

## 2023-06-11 ENCOUNTER — Other Ambulatory Visit: Payer: Self-pay | Admitting: Internal Medicine

## 2023-06-16 ENCOUNTER — Telehealth: Payer: Self-pay | Admitting: Internal Medicine

## 2023-06-16 ENCOUNTER — Telehealth (HOSPITAL_COMMUNITY): Admitting: Psychiatry

## 2023-06-16 NOTE — Telephone Encounter (Signed)
 Rc'd [employer] fax for 02. Will put in Dr. Jacqui Mau box for signature.

## 2023-06-25 NOTE — Telephone Encounter (Signed)
 LMN signed and returned to [employer] via fax.

## 2023-06-29 ENCOUNTER — Other Ambulatory Visit: Payer: Self-pay | Admitting: Internal Medicine

## 2023-06-29 MED ORDER — OLMESARTAN MEDOXOMIL 40 MG PO TABS: 40.0000 mg | ORAL_TABLET | Freq: Every day | ORAL | 11 refills | Status: DC

## 2023-06-29 NOTE — Telephone Encounter (Signed)
 Last Fill: 06/09/22  Last OV: 04/19/23 Next OV: 07/20/23  Routing to provider for review/authorization.

## 2023-06-29 NOTE — Telephone Encounter (Signed)
 Copied from CRM [id number]. Topic: Clinical - Medication Refill >> Jun 29, 2023 10:27 AM Isabell A wrote: Most Recent Primary Care Visit:   Medication: olmesartan  (BENICAR ) 40 MG tablet  Has the patient contacted their pharmacy? Yes (Agent: If no, request that the patient contact the pharmacy for the refill. If patient does not wish to contact the pharmacy document the reason why and proceed with request.) (Agent: If yes, when and what did the pharmacy advise?)  Is this the correct pharmacy for this prescription? Yes If no, delete pharmacy and type the correct one.  This is the patient's preferred pharmacy:   [facility] [place] - [place], [place] - [address] [place] [address] Phone: [phone] Fax: [phone]  Has the prescription been filled recently? Yes  Is the patient out of the medication? Yes  Has the patient been seen for an appointment in the last year OR does the patient have an upcoming appointment? Yes  Can we respond through MyChart? No  Agent: Please be advised that Rx refills may take up to 3 business days. We ask that you follow-up with your pharmacy.

## 2023-07-09 ENCOUNTER — Ambulatory Visit: Attending: Nurse Practitioner | Admitting: Nurse Practitioner

## 2023-07-09 ENCOUNTER — Encounter: Payer: Self-pay | Admitting: Nurse Practitioner

## 2023-07-09 VITALS — BP 124/80 | HR 82 | Ht 63.0 in | Wt 146.8 lb

## 2023-07-09 DIAGNOSIS — Z59819 Housing instability, housed unspecified: Secondary | ICD-10-CM | POA: Diagnosis not present

## 2023-07-09 DIAGNOSIS — I2510 Atherosclerotic heart disease of native coronary artery without angina pectoris: Secondary | ICD-10-CM | POA: Diagnosis not present

## 2023-07-09 DIAGNOSIS — I10 Essential (primary) hypertension: Secondary | ICD-10-CM | POA: Diagnosis not present

## 2023-07-09 DIAGNOSIS — Z86718 Personal history of other venous thrombosis and embolism: Secondary | ICD-10-CM | POA: Diagnosis not present

## 2023-07-09 DIAGNOSIS — R600 Localized edema: Secondary | ICD-10-CM

## 2023-07-09 DIAGNOSIS — R634 Abnormal weight loss: Secondary | ICD-10-CM

## 2023-07-09 DIAGNOSIS — F439 Reaction to severe stress, unspecified: Secondary | ICD-10-CM

## 2023-07-09 DIAGNOSIS — E785 Hyperlipidemia, unspecified: Secondary | ICD-10-CM | POA: Diagnosis not present

## 2023-07-09 MED ORDER — CLOPIDOGREL BISULFATE 75 MG PO TABS: 75.0000 mg | ORAL_TABLET | Freq: Every day | ORAL | 1 refills | Status: DC

## 2023-07-09 MED ORDER — APIXABAN 5 MG PO TABS: 5.0000 mg | ORAL_TABLET | Freq: Two times a day (BID) | ORAL | 5 refills | Status: DC

## 2023-07-09 MED ORDER — AMLODIPINE BESYLATE 10 MG PO TABS: 5.0000 mg | ORAL_TABLET | Freq: Every day | ORAL | 1 refills | Status: DC

## 2023-07-09 MED ORDER — OLMESARTAN MEDOXOMIL 40 MG PO TABS: 40.0000 mg | ORAL_TABLET | Freq: Every day | ORAL | 11 refills | Status: DC

## 2023-07-09 MED ORDER — NITROGLYCERIN 0.4 MG SL SUBL: 0.4000 mg | SUBLINGUAL_TABLET | SUBLINGUAL | 3 refills | Status: AC | PRN

## 2023-07-09 MED ORDER — ROSUVASTATIN CALCIUM 5 MG PO TABS: 5.0000 mg | ORAL_TABLET | Freq: Every day | ORAL | 1 refills | Status: DC

## 2023-07-09 MED ORDER — EZETIMIBE 10 MG PO TABS: 10.0000 mg | ORAL_TABLET | Freq: Every day | ORAL | 1 refills | Status: DC

## 2023-07-09 NOTE — Progress Notes (Signed)
 Office Visit    Patient Name: Hayley Juarez Date of Encounter: 07/09/2023 PCP:  Theoplis Fix, MD [facility]  Cardiologist:  Armida Lander, MD  Advanced Practice Provider:  No care team member to display Electrophysiologist:  None   Chief Complaint and HPI    Hayley Juarez is a 63 y.o. female with a hx of CAD, status post NSTEMI in March 2024, hypertension, hyperlipidemia, prior history of DVT, chronic venous insufficiency, s/p laser ablation of right great saphenous vein in 2022, and COPD, who presents today for follow-up.  Admitted to [facility] in March 2024 as transfer from [facility] for NSTEMI.  Troponins elevated.  Echo revealed EF 55 to 60%, no RWMA.  Left heart cath revealed 60% distal LAD stenosis, 70% distal RCA stenosis, and 85% LP AV stenosis, PCI was deferred given no high-grade proximal stenoses, medical management recommended.  Was recommended to be on Eliquis  and Plavix  x 6 months, then stop Plavix .  During this admission, she was treated for COPD exacerbation.  Seen by Woodfin Hays, PA-C on May 06, 2022.  She noted muscle aches/fatigue since hospital discharge, said her symptoms have progressed since before hospital admission.  Continue to note occasional chest discomfort, improved with heat pack application along chest.  Denied any exertional symptoms.  Noted intermittent dyspnea, improved with use of inhalers.  Denied any other associated symptoms.  Was referred to pulmonology for history of COPD.  Admitted to [facility] later that month for acute exacerbation of COPD.  During hospital stay, did qualify for home oxygen  at 2 L.  Admitted to [facility] 05/2022 for acute hypercapnic respiratory failure, was having worsening shortness of breath and cough.  Chest x-ray showed opacities, congestion versus atypical infection.  Placed on BiPAP and treated for COPD exacerbation.  Last seen by Dr. Amanda Jungling 10/2022. Admitted to  atypical CP. Was unsure what medication she was taking.  Dr. Amanda Jungling recommended that in setting of ACS and low bleeding risk would favor 1 year of Plavix .  Recommended to come back for nursing visit to clarify medication regimen.  12/28/2022 - Today she presents for 6 week follow-up. She states her chest pain has improved, describes sensation as a "twitching pain." Admits to intermittent leg edema at times. Denies any  shortness of breath, palpitations, syncope, presyncope, dizziness, orthopnea, PND,  significant weight changes, acute bleeding, or claudication.  04/02/2023 - Today she presents for follow-up.  She says she has not been doing well recently, admits to some family problems. Denies any red flag signs/symptoms.  Admits to intermittent chest pain that has been chronic, also admits to generalized pains and says her stomach has been hurting, legs have been hurting.  She says she took a hydrocodone medication today that has helped ease her pain.  Says she has not taken nitroglycerin .  Admits to sensation of feet burning/neuropathy symptoms in the morning.  When describing her chest pain, she says this occurs along both sides of her chest, denies any central chest pain, and occurs underneath her breast tissue, she does admit to left arm pain but says she had procedure along this side in the past.  She says alleviating factors of her chest pain include hydrocodone, Lyrica, and nitroglycerin .  She also admits aggravating factors include emotional upset.  Denies any recent worsening symptoms or active chest pain. Denies any shortness of breath, palpitations, syncope, presyncope, dizziness, orthopnea, PND, swelling or significant weight changes, acute bleeding, or claudication.  She says  reducing amlodipine  to 5 mg daily helped improve her leg swelling.  07/08/2023 -patient comes in today very emotionally upset and tearful she tells me she is no longer living in her house that she rented out.  She is currently  living in a motel.  Food access is limited.  She admits to unintentional weight loss due to stress and recent loss of her house.  Tells me she was pain went regularly when she got attending noticed that she had Aleve-said her house was sold.  Only family contact nearby is her daughter who has a past history of mini strokes and physically unable to care for her mom.  She also says daughter is unable to house patient due to the fact that her daughter is sleeping on the couch in her living environment. Pt admits to panic attacks. Denies any SI/HI. Denies any chest pain, shortness of breath, palpitations, syncope, presyncope, dizziness, orthopnea, PND, swelling or significant weight changes, acute bleeding, or claudication.    EKGs/Labs/Other Studies Reviewed:   The following studies were reviewed today:   EKG:  EKG is not ordered today.       Echo 04/2022:  1. Left ventricular ejection fraction, by estimation, is 55 to 60%. The  left ventricle has normal function. The left ventricle has no regional  wall motion abnormalities. There is mild concentric left ventricular  hypertrophy. Left ventricular diastolic  parameters are consistent with Grade I diastolic dysfunction (impaired  relaxation).   2. Right ventricular systolic function is normal. The right ventricular  size is normal. Tricuspid regurgitation signal is inadequate for assessing  PA pressure.   3. Left atrial size was mildly dilated.   4. The mitral valve is normal in structure. No evidence of mitral valve  regurgitation. No evidence of mitral stenosis.   5. The aortic valve is tricuspid. There is mild calcification of the  aortic valve. Aortic valve regurgitation is not visualized. No aortic  stenosis is present.   6. The inferior vena cava is normal in size with greater than 50%  respiratory variability, suggesting right atrial pressure of 3 mmHg.  LHC 04/2022:   Dist LAD lesion is 60% stenosed.   LPAV lesion is 85%  stenosed.   Dist RCA lesion is 70% stenosed.   1.  Disease of the distal LAD, distal circumflex, and distal right coronary artery.  The right coronary artery is severely tortuous.  Given the lack of high-grade proximal obstructive disease PCI was deferred. 2.  Ventriculography and left heart catheterization demonstrated preserved ejection fraction and LVEDP of 17 mmHg.   Recommendation: Medical therapy . Review of Systems    All other systems reviewed and are otherwise negative except as noted above.  Physical Exam    VS:  There were no vitals taken for this visit. , BMI There is no height or weight on file to calculate BMI.  Wt Readings from Last 3 Encounters:  04/19/23 160 lb 12.8 oz (72.9 kg)  04/02/23 153 lb (69.4 kg)  01/13/23 159 lb (72.1 kg)     GEN: Well nourished, well developed, in no acute distress. HEENT: normal. Neck: Supple, no JVD, carotid bruits, or masses. Cardiac: S1/S2, RRR, no murmurs, rubs, or gallops. No clubbing, cyanosis. No edema to BLE. Radials/PT 2+ and equal bilaterally.  Respiratory:  Respirations regular and unlabored, clear to auscultation bilaterally. GI: Soft, nontender, nondistended. MS: No deformity or atrophy. Skin: Warm and dry, no rash. Neuro:  Strength and sensation are intact. Psych: Normal affect.  Assessment & Plan    CAD, s/p NSTEMI Denies any recent anginal symptoms. See LHC report noted above from 2024, PCI was deferred. Medical therapy recommended. No indication for ischemic evaluation. Continue current medication regimen.  Heart healthy diet and regular cardiovascular exercise encouraged. Care and ED precautions discussed. Previously consulted Dr. Amanda Jungling to see if Plavix  can be stopped as this was continue this for 6 months, he recommended to continue until 04/2023. Will d/c at this time and update pt accordingly.   HTN BP stable. Discussed to monitor BP at home at least 2 hours after medications and sitting for 5-10 minutes. Heart  healthy diet and regular cardiovascular exercise encouraged.  No medication changes at this time.  HLD Will request most recent labs from PCP's office. Continue rosuvastatin . Heart healthy diet and regular cardiovascular exercise encouraged.   Leg edema, prior hx of DVT This has resolved after decreasing amlodipine  to 5 mg daily.  No edema noted on exam.  Recommended low salt, heart healthy diet and regular cardiovascular exercise encouraged and leg elevation PRN.  She is on Eliquis  due to past history of DVT, continue Eliquis  5 mg BID, denies any bleeding issues.  Will provide refills per her request.  5. Stress, housing instability/ unintentional weight loss Patient is very emotionally upset.  Denies any SI/HI.  Currently living in a motel.  She has lost quite a bit of weight since earlier this year from the stress and emotional upset per her report.  Will route note to LCSW to determine next steps and ways to provide support. Support given and discussed [phone]. She verbalized understanding.   Disposition: Will provide refills per her request.  Follow up in 3 months with Armida Lander, MD or APP.  Signed, Lasalle Pointer, NP

## 2023-07-09 NOTE — Patient Instructions (Addendum)

## 2023-07-13 ENCOUNTER — Encounter: Payer: Self-pay | Admitting: Nurse Practitioner

## 2023-07-15 ENCOUNTER — Telehealth (HOSPITAL_BASED_OUTPATIENT_CLINIC_OR_DEPARTMENT_OTHER): Payer: Self-pay | Admitting: Licensed Clinical Social Worker

## 2023-07-15 ENCOUNTER — Telehealth: Payer: Self-pay | Admitting: Nurse Practitioner

## 2023-07-15 NOTE — Telephone Encounter (Signed)
 Left detailed message regarding this medication. Phone call went straight to voicemail.

## 2023-07-15 NOTE — Progress Notes (Signed)
 Heart and Vascular Care Navigation  07/15/2023  Hayley Juarez 25-Jan-1961 [id number]  Reason for Referral: housing challenges  Patient is participating in a Managed Medicaid Plan:No, UHC Medicare and Medicaid  Engaged with patient by telephone for initial visit for Heart and Vascular Care Coordination.                                                                                                   Assessment:                                    LCSW was able to reach pt this morning at [phone]. Left voicemail that was promptly returned. Introduced self, role, reason for call. Pt shares her landlord gave her 10 days notice before selling the property she had lived in for about 9 years. She currently resides since then in a motel with a friend who helps her out given her health needs- receives Tree surgeon, and her friend gives her money towards housing as well. Does not receive SNAP (previously received $19 until her husband filed for divorce last year) and so sometimes since being in the hotel the cost of housing has made affording food more difficult. Is okay with me making referral via [employer] for SNAP application.   Has $312/mo on [employer] for food, medical costs, utilities etc. Unfortunately has a large utility bill for energy that has gone unpaid since moving from previous home. Discussed contacted DSS and will attempt sending this through [facility] but unfortunately [employer] program ends as late as March. If not eligible for DSS assistance will assess for any other options in community. Pt has a daughter with several health needs that is nearby but cannot provide hands on assistance. She has access to transportation, sees her doctors regularly and tries not to miss appointments. Has mental health support from Dr. Alfredia Annas, Psychiatrist with [facility]. She shares she has appreciated the care from [facility] about her current situation. We discussed who to contact with health and  mental health concerns as needed.   She shares that she has an appointment for a home in [place] this weekend and thinks it should work out. She is not keen on the area that it is in safety wise but the rent is $600/mo which is manageable for them and gives them time to get out of hotel and look for alternative housing again if needed. Pt agreeable to me following up on Wednesday of next week to see if any updates and update address in chart.   No additional questions at this time. Will f/u as discussed.   HRT/VAS Care Coordination     Patients Home Cardiology Office [facility]   Outpatient Care Team Social Worker   Social Worker Name: Nathen Balder, [place], [phone]   Living arrangements for the past 2 months Single Family Home; Hotel/Motel   Lives with: Friends   Patient Current Insurance Coverage Managed Medicare; Medicaid   Patient Has Concern With Paying Medical Bills No   Does  Patient Have Prescription Coverage? Yes       Social History:                                                                             SDOH Screenings   Food Insecurity: Food Insecurity Present (07/15/2023)  Housing: High Risk (07/15/2023)  Transportation Needs: No Transportation Needs (07/15/2023)  Utilities: Low Risk  (06/15/2022)   Received from [facility]  Depression (PHQ2-9): Medium Risk (04/06/2022)  Financial Resource Strain: Medium Risk (07/15/2023)  Physical Activity: Inactive (06/15/2022)   Received from [facility], [facility]  Social Connections: Socially Isolated (06/15/2022)   Received from [facility], [facility]  Stress: Stress Concern Present (07/15/2023)  Tobacco Use: Medium Risk (04/19/2023)  Health Literacy: Adequate Health Literacy (07/15/2023)    SDOH Interventions: Financial Resources:  Financial Strain Interventions: [id number] Referral (referral for SNAP assistance and has a past due energy bill will see if any crisis assistance available) DSS for  financial assistance  Food Insecurity:  Food Insecurity Interventions: [id number] Referral (referred to DSS for SNAP application)  Housing Insecurity:  Housing Interventions: Other (Comment) (pt should have housing again this weekend- currently residing in a motel will f/u next week)  Transportation:   Transportation Interventions: Intervention Not Indicated, Payor Benefit    Other Care Navigation Interventions:     Provided Pharmacy assistance resources  Pt denies any issues obtaining medications at this time  Patient expressed Mental Health concerns Yes, Referred to:  pt already managed by psychatrist Dr. Alfredia Annas with [facility]. Feels well supported by her and other providers- no additional resources requested at this time    Follow-up plan:   LCSW has sent referral for [facility] and utility bill assistance to [facility] DSS to see if eligible for assistance. Will f/u as discussed next Wednesday to see if able to secure housing as hoped. Encouraged her to call as needed and will f/u on referrals for any updates.

## 2023-07-15 NOTE — Telephone Encounter (Signed)
-----   Message from Lasalle Pointer sent at 07/15/2023  2:03 PM EDT ----- Please let patient know that I have already spoken to Dr. Amanda Jungling and she is okay to stop Plavix .  Please let her know this.   Thanks!   Best, Lasalle Pointer, NP

## 2023-07-16 ENCOUNTER — Telehealth (INDEPENDENT_AMBULATORY_CARE_PROVIDER_SITE_OTHER): Admitting: Psychiatry

## 2023-07-16 ENCOUNTER — Encounter (HOSPITAL_COMMUNITY): Payer: Self-pay | Admitting: Psychiatry

## 2023-07-16 DIAGNOSIS — F331 Major depressive disorder, recurrent, moderate: Secondary | ICD-10-CM | POA: Diagnosis not present

## 2023-07-16 MED ORDER — CLONAZEPAM 0.5 MG PO TABS: 0.5000 mg | ORAL_TABLET | Freq: Two times a day (BID) | ORAL | 2 refills | Status: DC | PRN

## 2023-07-16 MED ORDER — VORTIOXETINE HBR 20 MG PO TABS: 20.0000 mg | ORAL_TABLET | Freq: Every day | ORAL | 2 refills | Status: DC

## 2023-07-16 MED ORDER — QUETIAPINE FUMARATE 200 MG PO TABS: 200.0000 mg | ORAL_TABLET | Freq: Every day | ORAL | 2 refills | Status: DC
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
## 2023-07-19 ENCOUNTER — Ambulatory Visit: Payer: Self-pay | Admitting: Nurse Practitioner

## 2023-07-20 ENCOUNTER — Ambulatory Visit: Payer: 59 | Admitting: Internal Medicine

## 2023-07-20 ENCOUNTER — Encounter: Payer: Self-pay | Admitting: Internal Medicine

## 2023-07-20 VITALS — BP 130/88 | HR 90 | Ht 63.0 in | Wt 146.0 lb

## 2023-07-20 DIAGNOSIS — F17210 Nicotine dependence, cigarettes, uncomplicated: Secondary | ICD-10-CM | POA: Diagnosis not present

## 2023-07-20 DIAGNOSIS — R0609 Other forms of dyspnea: Secondary | ICD-10-CM | POA: Diagnosis not present

## 2023-07-20 MED ORDER — TRELEGY ELLIPTA 100-62.5-25 MCG/ACT IN AEPB: 1.0000 | INHALATION_SPRAY | Freq: Every day | RESPIRATORY_TRACT | Status: AC

## 2023-07-20 NOTE — Patient Instructions (Signed)
 Plan A = Automatic = Always=    Trelegy 200 one click 1st thing in am  Work on inhaler technique:  relax and gently blow all the way out then take a nice smooth full deep breath back in. Hold breath in for at least  5 seconds if you can. Blow out trelegt  thru nose. Rinse and gargle with water when done.  If mouth or throat bother you at all,  try brushing teeth/gums/tongue with arm and hammer toothpaste/ make a slurry and gargle and spit out.   Plan B = Backup (to supplement plan A, not to replace it) Only use your albuterol  inhaler as a rescue medication to be used if you can't catch your breath by resting or doing a relaxed purse lip breathing pattern.  - The less you use it, the better it will work when you need it. - Ok to use the inhaler up to 2 puffs  every 4 hours if you must but call for appointment if use goes up over your usual need - Don't leave home without it !!  (think of it like the spare tire for your car)   Plan C = Crisis (instead of Plan B but only if Plan B stops working) - only use your albuterol  nebulizer if you first try Plan B and it fails to help > ok to use the nebulizer up to every 4 hours but if start needing it regularly call for immediate appointment   Please schedule a follow up office visit in 6 weeks, call sooner if needed

## 2023-07-20 NOTE — Addendum Note (Signed)
 Addended by: Michael Ades T on: 07/20/2023 11:48 AM   Modules accepted: Orders

## 2023-07-20 NOTE — Addendum Note (Signed)
 Addended by: Michael Ades T on: 07/20/2023 11:07 AM   Modules accepted: Orders

## 2023-07-20 NOTE — Assessment & Plan Note (Signed)
 Referred for [facility]  06/09/2022 >>> again 07/15/2022  > did not qualify due to insufficient pk years   4-5 min discussion re active cigarette smoking in addition to office E&M  Ask about tobacco use:   ongoing  Advise quitting   I took an extended  opportunity with this patient to outline the consequences of continued cigarette use  in airway disorders based on all the data we have from the multiple national lung health studies (perfomed over decades at millions of dollars in cost)  indicating that smoking cessation, not choice of inhalers or pulmonary physicians, is the most important aspect of her  care and will substantially reduce her costs both dirrect and indirect with sustained sabstinence  Assess willingness:  Not committed at this point Assist in quit attempt:  Per PCP when ready Arrange follow up:   Follow up per Primary Care planned

## 2023-07-20 NOTE — Assessment & Plan Note (Addendum)
 Quit smoking 05/2022 at admit for "copd exac" -  06/09/2022   continue breztri for now pending pfts - Allergy screen  06/09/22    Eos 1.7   -  07/15/2022  After extensive coaching inhaler device,  effectiveness =    60% from baseline of 30% > continue breztri > insurance did not cover and no change off sample as of 10/13/2022 > try duoneb / bud 0.25 mg bid (due to elevated Eos)  - 10/13/2022   Walked on RA  x  3  lap(s) =  approx 450  ft  @ nl  pace, stopped due to end of study  with lowest 02 sats 98%  - 01/13/2023 resting sats 98% RA  - PFT's  03/18/23  FEV1 1.31 (57 % ) ratio 0.72  p 0 % improvement from saba p 0 prior to study with DLCO  9 (48%)   and FV curve minimal concavity and ERV 35 at wt 161   - 04/19/2023  After extensive coaching inhaler device,  effectiveness =    50% ( late trigger/ short ti) - 04/19/2023   Walked on RA  x  3  lap(s) =  approx 450  ft  @ slow/ cane  pace, stopped due to end of study  with lowest 02 sats 89% on RA    Pt is COPD 0/AB and has run out of funds/ living in car but planning to move into appt this week so little in the short run can offer than trelegy 200 samples and f/u in 6 weeks   07/20/2023  After extensive coaching inhaler device,  effectiveness =    90% with DPI/ elipta         Each maintenance medication was reviewed in detail including emphasizing most importantly the difference between maintenance and prns and under what circumstances the prns are to be triggered using an action plan format where appropriate.  Total time for H and P, chart review, counseling, reviewing dpi/ hfa/ neb  device(s) and generating customized AVS unique to this office visit / same day charting = 24 min

## 2023-07-21 ENCOUNTER — Telehealth: Payer: Self-pay | Admitting: Licensed Clinical Social Worker

## 2023-07-21 NOTE — Telephone Encounter (Signed)
 H&V Care Navigation CSW Progress Note  Clinical Social Worker contacted patient by phone to f/u as discussed. Pt reached at [phone]. Re-introduced self, role, reason for call. Pt has moved into apartment at [address] [place], [address]. Confirmed she completed application with DSS from my referral for SNAP. Pt encouraged to update address with them as they will send determination to the most up to date address they have. No additional questions at this time, will send community resources and my card to use as needed. Pt appreciative of check in and support from clinic, shared she had a family member also go to [facility] and have a good experience. LCSW will pass this on to provider. Remain available as needed.   Patient is participating in a Managed Medicaid Plan:  No, UHC Medicare dual complete plan  SDOH Screenings   Food Insecurity: Food Insecurity Present (07/15/2023)  Housing: High Risk (07/15/2023)  Transportation Needs: No Transportation Needs (07/15/2023)  Utilities: Low Risk  (06/15/2022)   [facility] from [facility]  Depression (PHQ2-9): Medium Risk (04/06/2022)  Financial Resource Strain: Medium Risk (07/15/2023)  Physical Activity: Inactive (06/15/2022)   [facility], [facility]  Social Connections: Socially Isolated (06/15/2022)   Received from [facility], [facility]  Stress: Stress Concern Present (07/15/2023)  Tobacco Use: Medium Risk (07/20/2023)  Health Literacy: Adequate Health Literacy (07/15/2023)    Nathen Balder, MSW, LCSW Clinical Social Worker II [facility] Heart/Vascular Care Navigation  [phone]- work cell phone (preferred)

## 2023-07-23 DIAGNOSIS — M170 Bilateral primary osteoarthritis of knee: Secondary | ICD-10-CM | POA: Diagnosis not present

## 2023-08-10 DIAGNOSIS — G894 Chronic pain syndrome: Secondary | ICD-10-CM | POA: Diagnosis not present

## 2023-08-10 DIAGNOSIS — M5412 Radiculopathy, cervical region: Secondary | ICD-10-CM | POA: Diagnosis not present

## 2023-08-10 DIAGNOSIS — M5416 Radiculopathy, lumbar region: Secondary | ICD-10-CM | POA: Diagnosis not present

## 2023-08-10 DIAGNOSIS — M79604 Pain in right leg: Secondary | ICD-10-CM | POA: Diagnosis not present

## 2023-08-10 DIAGNOSIS — M545 Low back pain, unspecified: Secondary | ICD-10-CM | POA: Diagnosis not present

## 2023-08-10 DIAGNOSIS — Z79891 Long term (current) use of opiate analgesic: Secondary | ICD-10-CM | POA: Diagnosis not present

## 2023-08-10 DIAGNOSIS — M542 Cervicalgia: Secondary | ICD-10-CM | POA: Diagnosis not present

## 2023-08-18 ENCOUNTER — Telehealth: Payer: Self-pay | Admitting: Internal Medicine

## 2023-08-18 NOTE — Telephone Encounter (Signed)
 LVM for patien tto call and discuss scheduling the PFT ordered by Dr. Sherene Sires

## 2023-08-26 ENCOUNTER — Encounter (HOSPITAL_COMMUNITY): Payer: Self-pay | Admitting: Psychiatry

## 2023-08-26 ENCOUNTER — Telehealth (HOSPITAL_COMMUNITY): Admitting: Psychiatry

## 2023-08-26 DIAGNOSIS — F331 Major depressive disorder, recurrent, moderate: Secondary | ICD-10-CM | POA: Diagnosis not present

## 2023-08-26 MED ORDER — CLONAZEPAM 0.5 MG PO TABS: 0.5000 mg | ORAL_TABLET | Freq: Two times a day (BID) | ORAL | 2 refills | Status: DC | PRN

## 2023-08-26 MED ORDER — QUETIAPINE FUMARATE 200 MG PO TABS: 200.0000 mg | ORAL_TABLET | Freq: Every day | ORAL | 2 refills | Status: DC

## 2023-08-26 MED ORDER — VORTIOXETINE HBR 20 MG PO TABS: 20.0000 mg | ORAL_TABLET | Freq: Every day | ORAL | 2 refills | Status: DC
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
## 2023-08-31 DIAGNOSIS — M51372 Other intervertebral disc degeneration, lumbosacral region with discogenic back pain and lower extremity pain: Secondary | ICD-10-CM | POA: Diagnosis not present

## 2023-08-31 DIAGNOSIS — M25661 Stiffness of right knee, not elsewhere classified: Secondary | ICD-10-CM | POA: Diagnosis not present

## 2023-08-31 DIAGNOSIS — Z87828 Personal history of other (healed) physical injury and trauma: Secondary | ICD-10-CM | POA: Diagnosis not present

## 2023-08-31 DIAGNOSIS — M2391 Unspecified internal derangement of right knee: Secondary | ICD-10-CM | POA: Diagnosis not present

## 2023-08-31 DIAGNOSIS — M5030 Other cervical disc degeneration, unspecified cervical region: Secondary | ICD-10-CM | POA: Diagnosis not present

## 2023-08-31 DIAGNOSIS — M170 Bilateral primary osteoarthritis of knee: Secondary | ICD-10-CM | POA: Diagnosis not present

## 2023-08-31 DIAGNOSIS — M25461 Effusion, right knee: Secondary | ICD-10-CM | POA: Diagnosis not present

## 2023-08-31 DIAGNOSIS — M5134 Other intervertebral disc degeneration, thoracic region: Secondary | ICD-10-CM | POA: Diagnosis not present

## 2023-08-31 DIAGNOSIS — Z981 Arthrodesis status: Secondary | ICD-10-CM | POA: Diagnosis not present

## 2023-09-06 DIAGNOSIS — M2391 Unspecified internal derangement of right knee: Secondary | ICD-10-CM | POA: Diagnosis not present

## 2023-09-06 DIAGNOSIS — M25461 Effusion, right knee: Secondary | ICD-10-CM | POA: Diagnosis not present

## 2023-09-06 DIAGNOSIS — M1711 Unilateral primary osteoarthritis, right knee: Secondary | ICD-10-CM | POA: Diagnosis not present

## 2023-09-06 DIAGNOSIS — M25661 Stiffness of right knee, not elsewhere classified: Secondary | ICD-10-CM | POA: Diagnosis not present

## 2023-09-06 DIAGNOSIS — S83241A Other tear of medial meniscus, current injury, right knee, initial encounter: Secondary | ICD-10-CM | POA: Diagnosis not present

## 2023-09-06 DIAGNOSIS — Z87828 Personal history of other (healed) physical injury and trauma: Secondary | ICD-10-CM | POA: Diagnosis not present

## 2023-09-07 NOTE — Telephone Encounter (Unsigned)
 Copied from CRM [id number]. Topic: Appointments - Scheduling Inquiry for Clinic >> Sep 07, 2023  1:46 PM Celestine FALCON wrote: Reason for CRM: Pt was called by [facility] regarding her PFT order. I attempted to schedule, but the patient stated she already had one completed a few months ago. Also, the order stated it needs to be completed at [facility] which denied scheduling. Pt stated she would like a call to discuss the need for the PFT. Pt's phone number is [phone]. >> Sep 07, 2023  2:07 PM Neville FALCON wrote: Spoke with patient regarding the PFT ordered 07/20/23 by Dr. Darlean.  Patient states she had this testing done in January of this year and does not wish to repeat this test ever again.  Told her I will inform Dr. Darlean
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
## 2023-09-17 ENCOUNTER — Encounter: Payer: Self-pay | Admitting: Internal Medicine

## 2023-09-17 ENCOUNTER — Ambulatory Visit (INDEPENDENT_AMBULATORY_CARE_PROVIDER_SITE_OTHER): Admitting: Internal Medicine

## 2023-09-17 VITALS — BP 112/76 | HR 75 | Ht 61.0 in | Wt 143.6 lb

## 2023-09-17 DIAGNOSIS — F17210 Nicotine dependence, cigarettes, uncomplicated: Secondary | ICD-10-CM | POA: Diagnosis not present

## 2023-09-17 DIAGNOSIS — R0609 Other forms of dyspnea: Secondary | ICD-10-CM | POA: Diagnosis not present

## 2023-09-17 MED ORDER — BREZTRI AEROSPHERE 160-9-4.8 MCG/ACT IN AERO: 2.0000 | INHALATION_SPRAY | Freq: Two times a day (BID) | RESPIRATORY_TRACT | Status: AC

## 2023-09-17 MED ORDER — BREZTRI AEROSPHERE 160-9-4.8 MCG/ACT IN AERO: INHALATION_SPRAY | RESPIRATORY_TRACT | 11 refills | Status: AC

## 2023-09-17 NOTE — Assessment & Plan Note (Signed)
 Referred for LDSCT  06/09/2022 >>> again 07/15/2022  > did not qualify due to insufficient pk years   - she has recently quit again and still does not qualify for LCS but reinforced recs I took an extended  opportunity with this patient to outline the consequences of continued cigarette use  in airway disorders based on all the data we have from the multiple national lung health studies (perfomed over decades at millions of dollars in cost)  indicating that mainttaining smoking cessation, not choice of inhalers or pulmonary physicians, is the most important aspect of her care.            Each maintenance medication was reviewed in detail including emphasizing most importantly the difference between maintenance and prns and under what circumstances the prns are to be triggered using an action plan format where appropriate.  Total time for H and P, chart review, counseling, reviewing hfa  device(s) and generating customized AVS unique to this office visit / same day charting = 35 min

## 2023-09-17 NOTE — Assessment & Plan Note (Signed)
 Quit smoking 05/2022 at admit for copd exac -  06/09/2022   continue breztri for now pending pfts - Allergy screen  06/09/22    Eos 1.7   -  07/15/2022  After extensive coaching inhaler device,  effectiveness =    60% from baseline of 30% > continue breztri > insurance did not cover and no change off sample as of 10/13/2022 > try duoneb / bud 0.25 mg bid (due to elevated Eos)  - 10/13/2022   Walked on RA  x  3  lap(s) =  approx 450  ft  @ nl  pace, stopped due to end of study  with lowest 02 sats 98%  - 01/13/2023 resting sats 98% RA  - PFT's  03/18/23  FEV1 1.31 (57 % ) ratio 0.72  p 0 % improvement from saba p 0 prior to study with DLCO  9 (48%)   and FV curve minimal concavity and ERV 35 at wt 161   - 04/19/2023  After extensive coaching inhaler device,  effectiveness =    50% ( late trigger/ short ti) - 04/19/2023   Walked on RA  x  3  lap(s) =  approx 450  ft  @ slow/ cane  pace, stopped due to end of study  with lowest 02 sats 89% on RA    >>> 09/17/2023  After extensive coaching inhaler device,  effectiveness =    75% with hfa from a basleine of < 50% so try off trelegy 100 and back to [place] Take 2 puffs first thing in am and then another 2 puffs about 12 hours later.   With approp saba  She has AB , not copd, and certainly does not not need trelegy 100 and if doing better with breztri / learns opitmal hfa may be able to taper the breztri as well using saba prn:  Re SABA :  I spent extra time with pt today reviewing appropriate use of albuterol  for prn use on exertion with the following points: 1) saba is for relief of sob that does not improve by walking a slower pace or resting but rather if the pt does not improve after trying this first. 2) If the pt is convinced, as many are, that saba helps recover from activity faster then it's easy to tell if this is the case by re-challenging : ie stop, take the inhaler, then p 5 minutes try the exact same activity (intensity of workload) that just caused  the symptoms and see if they are substantially diminished or not after saba 3) if there is an activity that reproducibly causes the symptoms, try the saba 15 min before the activity on alternate days   If in fact the saba really does help, then fine to continue to use it prn but advised may need to look closer at the maintenance regimen (breztri 2bid for now)  being used to achieve better control of airways disease with exertion.

## 2023-09-17 NOTE — Patient Instructions (Addendum)
 Plan A = Automatic = Always=    Breztri Take 2 puffs first thing in am and then another 2 puffs about 12 hours later are optional  -not need for the pm doses if doing fine    Work on inhaler technique:  relax and gently blow all the way out then take a nice smooth full deep breath back in, triggering the inhaler at same time you start breathing in.  Hold breath in for at least  5 seconds if you can. Blow out breztri  thru nose. Rinse and gargle with water when done.  If mouth or throat bother you at all,  try brushing teeth/gums/tongue with arm and hammer toothpaste/ make a slurry and gargle and spit out.      Plan B = Backup (to supplement plan A, not to replace it) Use your albuterol  inhaler as a rescue medication to be used if you can't catch your breath by resting or slowing your pace  or doing a relaxed purse lip breathing pattern.  - The less you use it, the better it will work when you need it. - Ok to use the inhaler up to 2 puffs  every 4 hours if you must but call for appointment if use goes up over your usual need - Don't leave home without it !!  (think of it like the spare tire or starter fluid for your car)   Plan C = Crisis (instead of Plan B but only if Plan B stops working) - only use your albuterol  nebulizer if you first try Plan B and it fails to help > ok to use the nebulizer up to every 4 hours but if start needing it regularly call for immediate appointment   Please schedule a follow up visit in  12  months but call sooner if needed

## 2023-09-20 DIAGNOSIS — M2391 Unspecified internal derangement of right knee: Secondary | ICD-10-CM | POA: Diagnosis not present

## 2023-09-20 DIAGNOSIS — M170 Bilateral primary osteoarthritis of knee: Secondary | ICD-10-CM | POA: Diagnosis not present

## 2023-10-04 DIAGNOSIS — M170 Bilateral primary osteoarthritis of knee: Secondary | ICD-10-CM | POA: Diagnosis not present

## 2023-10-04 DIAGNOSIS — M25561 Pain in right knee: Secondary | ICD-10-CM | POA: Diagnosis not present

## 2023-10-05 ENCOUNTER — Other Ambulatory Visit (HOSPITAL_COMMUNITY): Payer: Self-pay | Admitting: Psychiatry

## 2023-10-05 DIAGNOSIS — M5416 Radiculopathy, lumbar region: Secondary | ICD-10-CM | POA: Diagnosis not present

## 2023-10-05 DIAGNOSIS — Z79891 Long term (current) use of opiate analgesic: Secondary | ICD-10-CM | POA: Diagnosis not present

## 2023-10-05 DIAGNOSIS — M542 Cervicalgia: Secondary | ICD-10-CM | POA: Diagnosis not present

## 2023-10-05 DIAGNOSIS — M545 Low back pain, unspecified: Secondary | ICD-10-CM | POA: Diagnosis not present

## 2023-10-05 DIAGNOSIS — M79604 Pain in right leg: Secondary | ICD-10-CM | POA: Diagnosis not present

## 2023-10-05 DIAGNOSIS — M5412 Radiculopathy, cervical region: Secondary | ICD-10-CM | POA: Diagnosis not present

## 2023-10-05 DIAGNOSIS — G894 Chronic pain syndrome: Secondary | ICD-10-CM | POA: Diagnosis not present

## 2023-10-11 DIAGNOSIS — M1711 Unilateral primary osteoarthritis, right knee: Secondary | ICD-10-CM | POA: Diagnosis not present

## 2023-10-12 ENCOUNTER — Ambulatory Visit: Attending: Cardiology | Admitting: Cardiology

## 2023-10-12 ENCOUNTER — Encounter: Payer: Self-pay | Admitting: Cardiology

## 2023-10-12 VITALS — BP 126/86 | HR 80 | Ht 63.0 in | Wt 144.0 lb

## 2023-10-12 DIAGNOSIS — I10 Essential (primary) hypertension: Secondary | ICD-10-CM | POA: Diagnosis not present

## 2023-10-12 DIAGNOSIS — E782 Mixed hyperlipidemia: Secondary | ICD-10-CM

## 2023-10-12 DIAGNOSIS — I2510 Atherosclerotic heart disease of native coronary artery without angina pectoris: Secondary | ICD-10-CM | POA: Diagnosis not present

## 2023-10-12 DIAGNOSIS — Z86718 Personal history of other venous thrombosis and embolism: Secondary | ICD-10-CM

## 2023-10-12 MED ORDER — EZETIMIBE 10 MG PO TABS: 10.0000 mg | ORAL_TABLET | Freq: Every day | ORAL | 2 refills | Status: AC

## 2023-10-12 MED ORDER — AMLODIPINE BESYLATE 10 MG PO TABS: 5.0000 mg | ORAL_TABLET | Freq: Every day | ORAL | 2 refills | Status: DC

## 2023-10-12 MED ORDER — OLMESARTAN MEDOXOMIL 40 MG PO TABS: 40.0000 mg | ORAL_TABLET | Freq: Every day | ORAL | 2 refills | Status: AC

## 2023-10-12 MED ORDER — ROSUVASTATIN CALCIUM 10 MG PO TABS: 10.0000 mg | ORAL_TABLET | Freq: Every day | ORAL | 2 refills | Status: AC

## 2023-10-12 MED ORDER — METOPROLOL TARTRATE 25 MG PO TABS: 25.0000 mg | ORAL_TABLET | Freq: Two times a day (BID) | ORAL | 2 refills | Status: AC

## 2023-10-12 MED ORDER — APIXABAN 5 MG PO TABS: 5.0000 mg | ORAL_TABLET | Freq: Two times a day (BID) | ORAL | 5 refills | Status: DC

## 2023-10-12 NOTE — Patient Instructions (Signed)
 Medication Instructions:   Increase Crestor  10mg  daily  Amlodipine , Eliquis , Zetia , Metoprolol , Olmesartan  & Crestor  sent for 90 day  Continue all other medications.     Labwork:  none  Testing/Procedures:  none  Follow-Up:  6 weeks   Any Other Special Instructions Will Be Listed Below (If Applicable).   If you need a refill on your cardiac medications before your next appointment, please call your pharmacy.
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
## 2023-10-16 ENCOUNTER — Emergency Department (HOSPITAL_COMMUNITY)
Admission: EM | Admit: 2023-10-16 | Discharge: 2023-10-16 | Disposition: A | Discharge status: Home / Self Care | Attending: Student | Admitting: Student

## 2023-10-16 ENCOUNTER — Other Ambulatory Visit: Payer: Self-pay

## 2023-10-16 ENCOUNTER — Emergency Department (HOSPITAL_COMMUNITY)

## 2023-10-16 ENCOUNTER — Encounter (HOSPITAL_COMMUNITY): Payer: Self-pay | Admitting: Emergency Medicine

## 2023-10-16 DIAGNOSIS — S0990XA Unspecified injury of head, initial encounter: Secondary | ICD-10-CM | POA: Diagnosis not present

## 2023-10-16 DIAGNOSIS — Z79899 Other long term (current) drug therapy: Secondary | ICD-10-CM | POA: Diagnosis not present

## 2023-10-16 DIAGNOSIS — I2510 Atherosclerotic heart disease of native coronary artery without angina pectoris: Secondary | ICD-10-CM | POA: Diagnosis not present

## 2023-10-16 DIAGNOSIS — M549 Dorsalgia, unspecified: Secondary | ICD-10-CM | POA: Insufficient documentation

## 2023-10-16 DIAGNOSIS — S299XXA Unspecified injury of thorax, initial encounter: Secondary | ICD-10-CM | POA: Diagnosis not present

## 2023-10-16 DIAGNOSIS — R519 Headache, unspecified: Secondary | ICD-10-CM | POA: Diagnosis not present

## 2023-10-16 DIAGNOSIS — G8929 Other chronic pain: Secondary | ICD-10-CM | POA: Insufficient documentation

## 2023-10-16 DIAGNOSIS — I10 Essential (primary) hypertension: Secondary | ICD-10-CM | POA: Insufficient documentation

## 2023-10-16 DIAGNOSIS — S3992XA Unspecified injury of lower back, initial encounter: Secondary | ICD-10-CM | POA: Diagnosis not present

## 2023-10-16 DIAGNOSIS — Y9301 Activity, walking, marching and hiking: Secondary | ICD-10-CM | POA: Diagnosis not present

## 2023-10-16 DIAGNOSIS — S199XXA Unspecified injury of neck, initial encounter: Secondary | ICD-10-CM | POA: Diagnosis not present

## 2023-10-16 DIAGNOSIS — M542 Cervicalgia: Secondary | ICD-10-CM | POA: Diagnosis not present

## 2023-10-16 DIAGNOSIS — W01198A Fall on same level from slipping, tripping and stumbling with subsequent striking against other object, initial encounter: Secondary | ICD-10-CM | POA: Insufficient documentation

## 2023-10-16 DIAGNOSIS — Z043 Encounter for examination and observation following other accident: Secondary | ICD-10-CM | POA: Diagnosis not present

## 2023-10-16 DIAGNOSIS — M545 Low back pain, unspecified: Secondary | ICD-10-CM | POA: Diagnosis not present

## 2023-10-16 DIAGNOSIS — Z7901 Long term (current) use of anticoagulants: Secondary | ICD-10-CM | POA: Insufficient documentation

## 2023-10-16 DIAGNOSIS — W19XXXA Unspecified fall, initial encounter: Secondary | ICD-10-CM

## 2023-10-16 MED ORDER — LIDOCAINE 5 % EX PTCH: 1.0000 | MEDICATED_PATCH | CUTANEOUS | 0 refills | Status: AC

## 2023-10-16 MED ORDER — HYDROXYZINE HCL 25 MG PO TABS: 25.0000 mg | ORAL_TABLET | Freq: Once | ORAL | Status: DC

## 2023-10-16 MED ORDER — OXYCODONE HCL 5 MG PO TABS
5.0000 mg | ORAL_TABLET | Freq: Once | ORAL | Status: AC
  Administered 2023-10-16: 5 mg via ORAL
  Filled 2023-10-16: qty 1

## 2023-10-16 MED ORDER — METHOCARBAMOL 500 MG PO TABS: 500.0000 mg | ORAL_TABLET | Freq: Three times a day (TID) | ORAL | 0 refills | Status: DC | PRN

## 2023-10-16 MED ORDER — LIDOCAINE 5 % EX PTCH: 1.0000 | MEDICATED_PATCH | CUTANEOUS | 0 refills | Status: DC

## 2023-10-16 MED ORDER — LIDOCAINE 5 % EX PTCH
2.0000 | MEDICATED_PATCH | CUTANEOUS | Status: DC
  Administered 2023-10-16: 2 via TRANSDERMAL
  Filled 2023-10-16: qty 2

## 2023-10-16 MED ORDER — METHOCARBAMOL 500 MG PO TABS
1000.0000 mg | ORAL_TABLET | Freq: Once | ORAL | Status: AC
  Administered 2023-10-16: 1000 mg via ORAL
  Filled 2023-10-16: qty 2

## 2023-10-16 NOTE — ED Notes (Signed)
 Pt/family received d/c paperwork at this time. After going over the paperwork any questions, comments, or concerns were answered to the best of this nurse's knowledge. The pt/family verbally acknowledged the teachings/instructions.
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
## 2023-10-16 NOTE — Discharge Instructions (Addendum)
 Please walk with a cane.  Try ice heat or lidocaine  patch over area of pain.  Take at home pain medicine.  You can use muscle relaxer but do not drive or operate any machinery.  Follow-up with PCP and return to emergency room with new or worsening symptoms.

## 2023-10-18 DIAGNOSIS — M51372 Other intervertebral disc degeneration, lumbosacral region with discogenic back pain and lower extremity pain: Secondary | ICD-10-CM | POA: Diagnosis not present

## 2023-10-18 DIAGNOSIS — S8991XA Unspecified injury of right lower leg, initial encounter: Secondary | ICD-10-CM | POA: Diagnosis not present

## 2023-10-18 DIAGNOSIS — M5134 Other intervertebral disc degeneration, thoracic region: Secondary | ICD-10-CM | POA: Diagnosis not present

## 2023-10-18 DIAGNOSIS — X58XXXA Exposure to other specified factors, initial encounter: Secondary | ICD-10-CM | POA: Diagnosis not present

## 2023-10-18 DIAGNOSIS — M5030 Other cervical disc degeneration, unspecified cervical region: Secondary | ICD-10-CM | POA: Diagnosis not present

## 2023-10-18 DIAGNOSIS — M1711 Unilateral primary osteoarthritis, right knee: Secondary | ICD-10-CM | POA: Diagnosis not present

## 2023-10-28 DIAGNOSIS — Z299 Encounter for prophylactic measures, unspecified: Secondary | ICD-10-CM | POA: Diagnosis not present

## 2023-10-28 DIAGNOSIS — I509 Heart failure, unspecified: Secondary | ICD-10-CM | POA: Diagnosis not present

## 2023-10-28 DIAGNOSIS — R112 Nausea with vomiting, unspecified: Secondary | ICD-10-CM | POA: Diagnosis not present

## 2023-10-28 DIAGNOSIS — R197 Diarrhea, unspecified: Secondary | ICD-10-CM | POA: Diagnosis not present

## 2023-10-28 DIAGNOSIS — I82409 Acute embolism and thrombosis of unspecified deep veins of unspecified lower extremity: Secondary | ICD-10-CM | POA: Diagnosis not present

## 2023-10-28 DIAGNOSIS — R52 Pain, unspecified: Secondary | ICD-10-CM | POA: Diagnosis not present

## 2023-10-28 DIAGNOSIS — I722 Aneurysm of renal artery: Secondary | ICD-10-CM | POA: Diagnosis not present

## 2023-10-28 DIAGNOSIS — I10 Essential (primary) hypertension: Secondary | ICD-10-CM | POA: Diagnosis not present

## 2023-11-02 ENCOUNTER — Other Ambulatory Visit (HOSPITAL_COMMUNITY): Payer: Self-pay | Admitting: Psychiatry

## 2023-11-02 DIAGNOSIS — M79604 Pain in right leg: Secondary | ICD-10-CM | POA: Diagnosis not present

## 2023-11-02 DIAGNOSIS — M5416 Radiculopathy, lumbar region: Secondary | ICD-10-CM | POA: Diagnosis not present

## 2023-11-02 DIAGNOSIS — G894 Chronic pain syndrome: Secondary | ICD-10-CM | POA: Diagnosis not present

## 2023-11-02 DIAGNOSIS — Z79891 Long term (current) use of opiate analgesic: Secondary | ICD-10-CM | POA: Diagnosis not present

## 2023-11-02 DIAGNOSIS — M5412 Radiculopathy, cervical region: Secondary | ICD-10-CM | POA: Diagnosis not present

## 2023-11-02 DIAGNOSIS — M545 Low back pain, unspecified: Secondary | ICD-10-CM | POA: Diagnosis not present

## 2023-11-02 DIAGNOSIS — M542 Cervicalgia: Secondary | ICD-10-CM | POA: Diagnosis not present

## 2023-11-03 DIAGNOSIS — Z981 Arthrodesis status: Secondary | ICD-10-CM | POA: Diagnosis not present

## 2023-11-03 DIAGNOSIS — M5416 Radiculopathy, lumbar region: Secondary | ICD-10-CM | POA: Diagnosis not present

## 2023-11-03 DIAGNOSIS — M47812 Spondylosis without myelopathy or radiculopathy, cervical region: Secondary | ICD-10-CM | POA: Diagnosis not present

## 2023-11-03 DIAGNOSIS — M47816 Spondylosis without myelopathy or radiculopathy, lumbar region: Secondary | ICD-10-CM | POA: Diagnosis not present

## 2023-11-03 DIAGNOSIS — M1711 Unilateral primary osteoarthritis, right knee: Secondary | ICD-10-CM | POA: Diagnosis not present

## 2023-11-04 ENCOUNTER — Telehealth: Payer: Self-pay

## 2023-11-04 NOTE — Telephone Encounter (Signed)
   Pre-operative Risk Assessment    Patient Name: Hayley Juarez  DOB: 03/14/1960 MRN: [id number]   Date of last office visit: 10/12/23 DORN ROSS, MD Date of next office visit: 11/23/23 DORN ROSS, MD   Request for Surgical Clearance    Procedure:  TFESI  Date of Surgery:  Clearance TBD                                Surgeon:  ROCKEY PAE, MD Surgeon's Group or Practice Name:  [facility] Phone number:  [phone] Fax number:  [phone]   Type of Clearance Requested:   - Medical  - Pharmacy:  Hold Apixaban  (Eliquis ) 3 DAYS PRIOR   Type of Anesthesia:  Not Indicated   Additional requests/questions:    Signed, Lucie DELENA Ku   11/04/2023, 5:45 PM

## 2023-11-05 NOTE — Telephone Encounter (Signed)
   Name: Hayley Juarez  DOB: Dec 21, 1960  MRN: [id number]  Primary Cardiologist: Alvan Carrier, MD  Chart reviewed as part of pre-operative protocol coverage. The patient has an upcoming visit scheduled with Dr. Alvan on 11/23/2023 at which time clearance can be addressed in case there are any issues that would impact surgical recommendations.  TFESI Is not scheduled until TBD as below. I added preop FYI to appointment note so that provider is aware to address at time of outpatient visit.  Per office protocol the cardiology provider should forward their finalized clearance decision and recommendations regarding antiplatelet therapy to the requesting party below.    Warfarin is managed by PCP for hx of DVT. Surgeon will need to contact them for recommendations.   I will route this message as FYI to requesting party and remove this message from the preop box as separate preop APP input not needed at this time.   Please call with any questions.  Lamarr Satterfield, NP  11/05/2023, 7:46 AM

## 2023-11-08 DIAGNOSIS — M1711 Unilateral primary osteoarthritis, right knee: Secondary | ICD-10-CM | POA: Diagnosis not present

## 2023-11-19 DIAGNOSIS — J449 Chronic obstructive pulmonary disease, unspecified: Secondary | ICD-10-CM | POA: Diagnosis not present

## 2023-11-19 DIAGNOSIS — I509 Heart failure, unspecified: Secondary | ICD-10-CM | POA: Diagnosis not present

## 2023-11-19 DIAGNOSIS — Z299 Encounter for prophylactic measures, unspecified: Secondary | ICD-10-CM | POA: Diagnosis not present

## 2023-11-19 DIAGNOSIS — R52 Pain, unspecified: Secondary | ICD-10-CM | POA: Diagnosis not present

## 2023-11-19 DIAGNOSIS — I10 Essential (primary) hypertension: Secondary | ICD-10-CM | POA: Diagnosis not present

## 2023-11-19 DIAGNOSIS — Z0000 Encounter for general adult medical examination without abnormal findings: Secondary | ICD-10-CM | POA: Diagnosis not present

## 2023-11-23 ENCOUNTER — Ambulatory Visit: Attending: Cardiology | Admitting: Cardiology

## 2023-11-23 ENCOUNTER — Encounter: Payer: Self-pay | Admitting: Cardiology

## 2023-11-23 VITALS — BP 148/90 | HR 87 | Ht 63.0 in | Wt 141.6 lb

## 2023-11-23 DIAGNOSIS — I10 Essential (primary) hypertension: Secondary | ICD-10-CM

## 2023-11-23 DIAGNOSIS — I25118 Atherosclerotic heart disease of native coronary artery with other forms of angina pectoris: Secondary | ICD-10-CM

## 2023-11-23 MED ORDER — AMLODIPINE BESYLATE 5 MG PO TABS: 5.0000 mg | ORAL_TABLET | Freq: Every day | ORAL | 3 refills | Status: AC
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
## 2023-11-23 NOTE — Patient Instructions (Addendum)
 Medication Instructions:   New Amlodipine  5mg  tablet refilled today  Please notify office who stopped the Benicar  (Olmesartan ) Continue all other medications.     Labwork:  none  Testing/Procedures:  none  Follow-Up:  6 months   Any Other Special Instructions Will Be Listed Below (If Applicable).   If you need a refill on your cardiac medications before your next appointment, please call your pharmacy.

## 2023-12-13 DIAGNOSIS — M533 Sacrococcygeal disorders, not elsewhere classified: Secondary | ICD-10-CM | POA: Diagnosis not present

## 2023-12-13 DIAGNOSIS — R2681 Unsteadiness on feet: Secondary | ICD-10-CM | POA: Diagnosis not present

## 2023-12-13 DIAGNOSIS — M51372 Other intervertebral disc degeneration, lumbosacral region with discogenic back pain and lower extremity pain: Secondary | ICD-10-CM | POA: Diagnosis not present

## 2023-12-13 DIAGNOSIS — R200 Anesthesia of skin: Secondary | ICD-10-CM | POA: Diagnosis not present

## 2023-12-13 DIAGNOSIS — M1711 Unilateral primary osteoarthritis, right knee: Secondary | ICD-10-CM | POA: Diagnosis not present

## 2023-12-20 ENCOUNTER — Other Ambulatory Visit: Payer: Self-pay | Admitting: Internal Medicine

## 2023-12-20 DIAGNOSIS — Z1231 Encounter for screening mammogram for malignant neoplasm of breast: Secondary | ICD-10-CM

## 2023-12-23 DIAGNOSIS — M4854XA Collapsed vertebra, not elsewhere classified, thoracic region, initial encounter for fracture: Secondary | ICD-10-CM | POA: Diagnosis not present

## 2023-12-23 DIAGNOSIS — M5127 Other intervertebral disc displacement, lumbosacral region: Secondary | ICD-10-CM | POA: Diagnosis not present

## 2023-12-23 DIAGNOSIS — M533 Sacrococcygeal disorders, not elsewhere classified: Secondary | ICD-10-CM | POA: Diagnosis not present

## 2023-12-23 DIAGNOSIS — R200 Anesthesia of skin: Secondary | ICD-10-CM | POA: Diagnosis not present

## 2023-12-23 DIAGNOSIS — M47817 Spondylosis without myelopathy or radiculopathy, lumbosacral region: Secondary | ICD-10-CM | POA: Diagnosis not present

## 2023-12-23 DIAGNOSIS — R2681 Unsteadiness on feet: Secondary | ICD-10-CM | POA: Diagnosis not present

## 2023-12-23 DIAGNOSIS — M47816 Spondylosis without myelopathy or radiculopathy, lumbar region: Secondary | ICD-10-CM | POA: Diagnosis not present

## 2023-12-23 DIAGNOSIS — M4807 Spinal stenosis, lumbosacral region: Secondary | ICD-10-CM | POA: Diagnosis not present

## 2023-12-23 DIAGNOSIS — M51372 Other intervertebral disc degeneration, lumbosacral region with discogenic back pain and lower extremity pain: Secondary | ICD-10-CM | POA: Diagnosis not present

## 2023-12-28 ENCOUNTER — Encounter

## 2024-01-07 ENCOUNTER — Ambulatory Visit

## 2024-01-12 ENCOUNTER — Encounter

## 2024-02-29 ENCOUNTER — Ambulatory Visit
Admission: RE | Admit: 2024-02-29 | Discharge: 2024-02-29 | Disposition: A | Source: Ambulatory Visit | Attending: Internal Medicine | Admitting: Internal Medicine

## 2024-02-29 DIAGNOSIS — Z1231 Encounter for screening mammogram for malignant neoplasm of breast: Secondary | ICD-10-CM

## 2024-03-08 ENCOUNTER — Telehealth (HOSPITAL_COMMUNITY): Payer: Self-pay

## 2024-03-08 ENCOUNTER — Other Ambulatory Visit (HOSPITAL_COMMUNITY): Payer: Self-pay | Admitting: Psychiatry

## 2024-03-08 MED ORDER — CLONAZEPAM 0.5 MG PO TABS: 0.5000 mg | ORAL_TABLET | Freq: Two times a day (BID) | ORAL | 2 refills | Status: DC | PRN

## 2024-03-08 NOTE — Telephone Encounter (Signed)
 sent

## 2024-03-08 NOTE — Telephone Encounter (Signed)
 Pt called in requesting a refill on her  clonazePAM  (KLONOPIN ) 0.5 MG tablet sent to [facility] on [address]. Pt scheduled 03/22/24. Please advise.

## 2024-03-09 NOTE — Telephone Encounter (Signed)
 Spoke with pt advised rx was sent

## 2024-03-22 ENCOUNTER — Encounter (HOSPITAL_COMMUNITY): Payer: Self-pay | Admitting: Psychiatry

## 2024-03-22 ENCOUNTER — Telehealth (HOSPITAL_COMMUNITY): Admitting: Psychiatry

## 2024-03-22 DIAGNOSIS — F331 Major depressive disorder, recurrent, moderate: Secondary | ICD-10-CM

## 2024-03-22 MED ORDER — CLONAZEPAM 0.5 MG PO TABS: 0.5000 mg | ORAL_TABLET | Freq: Two times a day (BID) | ORAL | 2 refills | Status: AC | PRN

## 2024-03-22 MED ORDER — QUETIAPINE FUMARATE 200 MG PO TABS: 200.0000 mg | ORAL_TABLET | Freq: Every day | ORAL | 2 refills | Status: AC

## 2024-03-22 MED ORDER — VORTIOXETINE HBR 20 MG PO TABS: 20.0000 mg | ORAL_TABLET | Freq: Every day | ORAL | 2 refills | Status: AC
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
## 2024-05-11 ENCOUNTER — Ambulatory Visit: Admitting: Cardiology
